# Patient Record
Sex: Female | Born: 1980 | Race: White | Marital: Married | Smoking: Former smoker
Health system: Southern US, Community
[De-identification: ages and names within clinical notes are randomized; demographics above are authoritative.]

## PROBLEM LIST (undated history)

## (undated) DIAGNOSIS — E282 Polycystic ovarian syndrome: Secondary | ICD-10-CM

## (undated) DIAGNOSIS — G43909 Migraine, unspecified, not intractable, without status migrainosus: Secondary | ICD-10-CM

## (undated) DIAGNOSIS — D219 Benign neoplasm of connective and other soft tissue, unspecified: Secondary | ICD-10-CM

## (undated) DIAGNOSIS — I10 Essential (primary) hypertension: Secondary | ICD-10-CM

## (undated) DIAGNOSIS — F32A Depression, unspecified: Secondary | ICD-10-CM

## (undated) HISTORY — DX: Migraine, unspecified, not intractable, without status migrainosus: G43.909

## (undated) HISTORY — DX: Benign neoplasm of connective and other soft tissue, unspecified: D21.9

## (undated) HISTORY — DX: Depression, unspecified: F32.A

## (undated) HISTORY — DX: Essential (primary) hypertension: I10

## (undated) HISTORY — DX: Polycystic ovarian syndrome: E28.2

## (undated) HISTORY — PX: BREAST LUMPECTOMY: SHX2

---
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
## 2002-04-15 ENCOUNTER — Other Ambulatory Visit: Admission: RE | Admit: 2002-04-15 | Discharge: 2002-04-15 | Payer: Self-pay | Admitting: Family Medicine

## 2014-07-08 ENCOUNTER — Other Ambulatory Visit: Payer: Self-pay | Admitting: Orthopedic Surgery

## 2014-07-08 DIAGNOSIS — G8929 Other chronic pain: Secondary | ICD-10-CM

## 2014-07-08 DIAGNOSIS — M545 Low back pain: Principal | ICD-10-CM

## 2014-07-08 NOTE — Procedures (Signed)
°

## 2014-07-14 NOTE — Discharge Instructions (Signed)
Discogram Post Procedure Discharge Instructions ° °1. May resume a regular diet and any medications that you routinely take (including pain medications). °2. No driving day of procedure. °3. Upon discharge go home and rest for at least 4 hours.  May use an ice pack as needed to injection sites on back.  Ice to back 30 minutes on and 30 minutes off, all day. °4. May remove bandades later, today. °5. It is not unusual to be sore for several days after this procedure. ° ° ° °Please contact our office at [phone] for the following symptoms: ° °· Fever greater than 100 degrees °· Increased swelling, pain, or redness at injection site. ° ° °Thank you for visiting [facility]. ° ° °

## 2014-07-15 ENCOUNTER — Other Ambulatory Visit: Payer: Self-pay | Admitting: Orthopedic Surgery

## 2014-07-15 ENCOUNTER — Other Ambulatory Visit: Payer: Self-pay

## 2014-07-15 ENCOUNTER — Inpatient Hospital Stay
Admission: RE | Admit: 2014-07-15 | Discharge: 2014-07-15 | Disposition: A | Discharge status: Home / Self Care | Payer: Self-pay | Source: Ambulatory Visit | Attending: Orthopedic Surgery | Admitting: Orthopedic Surgery

## 2014-07-15 ENCOUNTER — Ambulatory Visit
Admission: RE | Admit: 2014-07-15 | Discharge: 2014-07-15 | Disposition: A | Discharge status: Home / Self Care | Payer: Worker's Compensation | Source: Ambulatory Visit | Attending: Orthopedic Surgery | Admitting: Orthopedic Surgery

## 2014-07-15 VITALS — BP 140/86 | HR 82 | Temp 97.8°F | Resp 20

## 2014-07-15 DIAGNOSIS — M545 Low back pain, unspecified: Secondary | ICD-10-CM

## 2014-07-15 DIAGNOSIS — G8929 Other chronic pain: Secondary | ICD-10-CM

## 2014-07-15 DIAGNOSIS — M5441 Lumbago with sciatica, right side: Secondary | ICD-10-CM

## 2014-07-15 DIAGNOSIS — M5442 Lumbago with sciatica, left side: Principal | ICD-10-CM

## 2014-07-15 MED ORDER — CEFAZOLIN SODIUM-DEXTROSE 2-3 GM-% IV SOLR
2.0000 g | Freq: Once | INTRAVENOUS | Status: AC
  Administered 2014-07-15: 2 g via INTRAVENOUS

## 2014-07-15 MED ORDER — MIDAZOLAM HCL 2 MG/2ML IJ SOLN
1.0000 mg | INTRAMUSCULAR | Status: DC | PRN
  Administered 2014-07-15: 2 mg via INTRAVENOUS
  Administered 2014-07-15 (×2): 1 mg via INTRAVENOUS

## 2014-07-15 MED ORDER — IOHEXOL 180 MG/ML  SOLN
10.5000 mL | Freq: Once | INTRAMUSCULAR | Status: AC | PRN
  Administered 2014-07-15: 10.5 mL

## 2014-07-15 MED ORDER — KETOROLAC TROMETHAMINE 30 MG/ML IJ SOLN
30.0000 mg | Freq: Once | INTRAMUSCULAR | Status: AC
  Administered 2014-07-15: 30 mg via INTRAVENOUS

## 2014-07-15 MED ORDER — FENTANYL CITRATE 0.05 MG/ML IJ SOLN
25.0000 ug | INTRAMUSCULAR | Status: DC | PRN
  Administered 2014-07-15: 75 ug via INTRAVENOUS
  Administered 2014-07-15: 25 ug via INTRAVENOUS

## 2014-07-15 MED ORDER — SODIUM CHLORIDE 0.9 % IV SOLN
INTRAVENOUS | Status: DC
  Administered 2014-07-15: 12:00:00 via INTRAVENOUS

## 2014-07-15 NOTE — Procedures (Signed)
°

## 2014-07-15 NOTE — Progress Notes (Signed)
Sedation time for discogram 47 minutes.

## 2015-01-09 IMAGING — CT CT L SPINE W/ CM
4 of 10 series · 11 of 33 positions shown, 13 images · non-contrast
Comparison: none

CLINICAL DATA: Low back pain extending into the lower extremities
bilaterally.

[Series 2: l spine bone · axial · 0.27mm/px · z∈[-284,-199]mm · 2 of 103 slices shown, 3 images]
[im 35/103  soft-tissue]
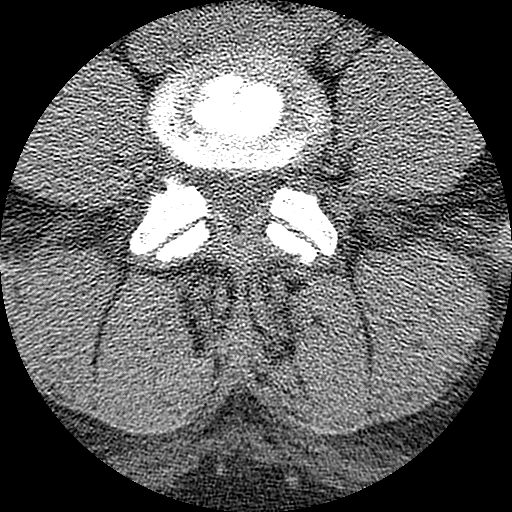
[im 35/103  bone]
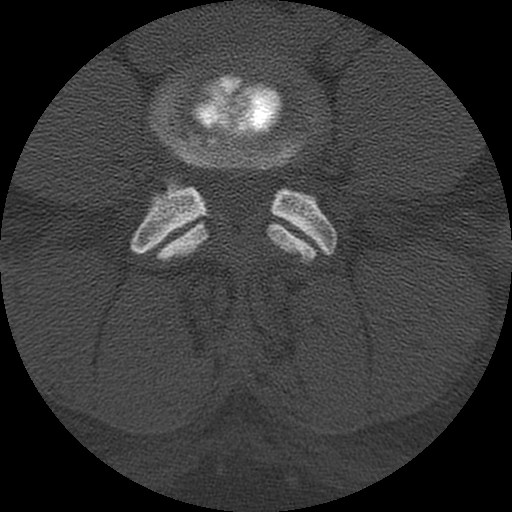
[im 69/103  bone]
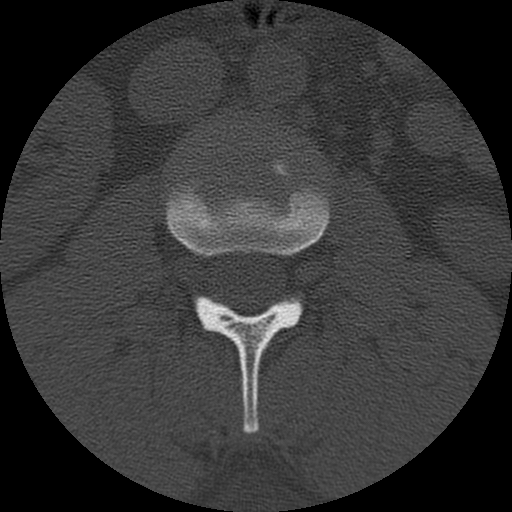

[Series 3: l spine soft · axial · 0.27mm/px · z∈[-284,-199]mm · 2 of 103 slices shown]
[im 35/103  soft-tissue]
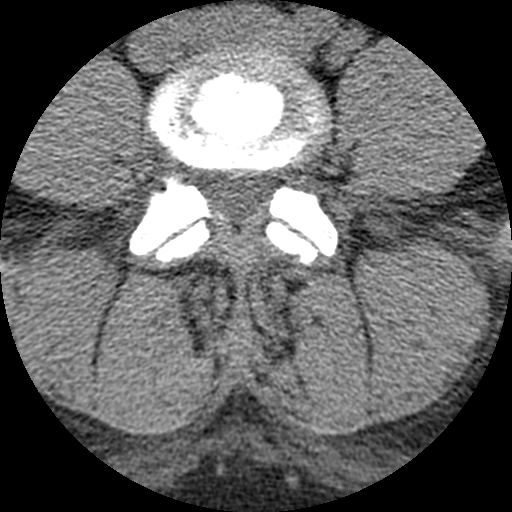
[im 69/103  soft-tissue]
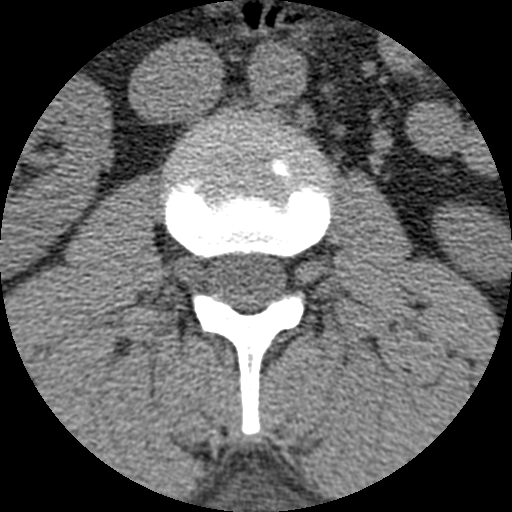

[Series 400: cor upper · coronal · 0.51mm/px · 2 of 57 slices shown]
[im 27/57  bone]
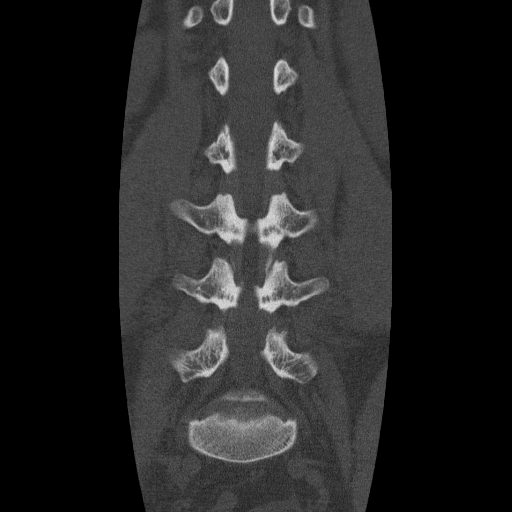
[im 53/57  bone]
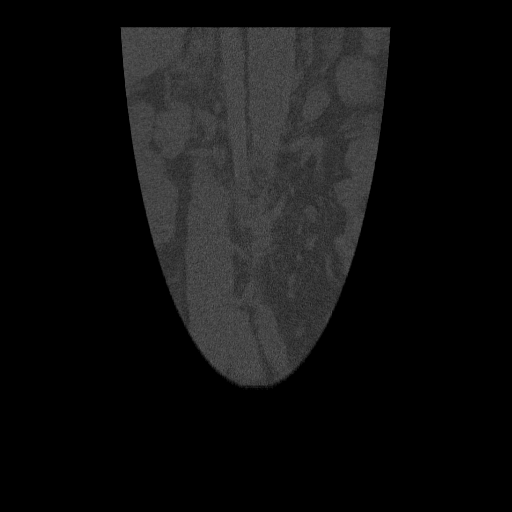

[Series 402: sag · sagittal · 0.51mm/px · 5 of 59 slices shown, 6 images]
[im 20/59  bone]
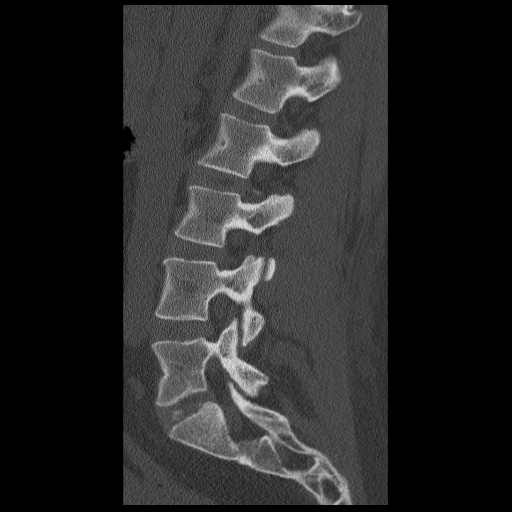
[im 25/59  bone]
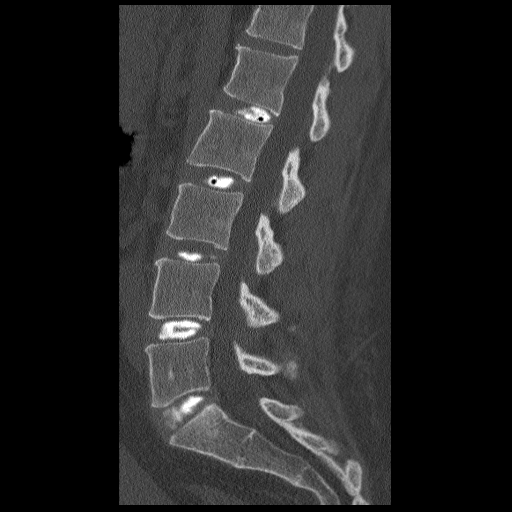
[im 30/59  soft-tissue]
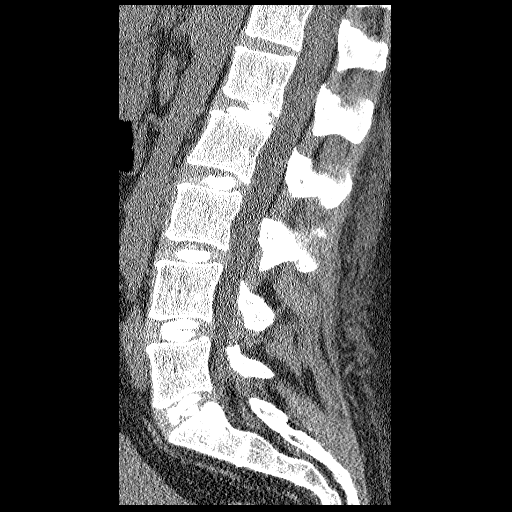
[im 30/59  bone]
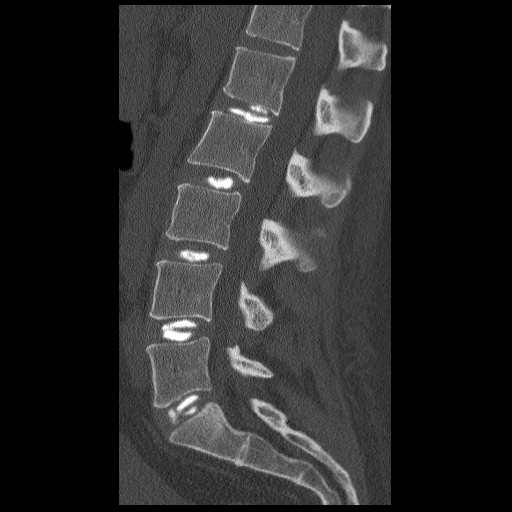
[im 34/59  bone]
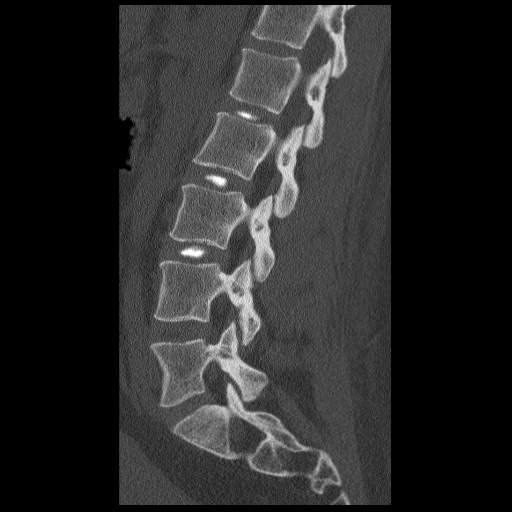
[im 39/59  bone]
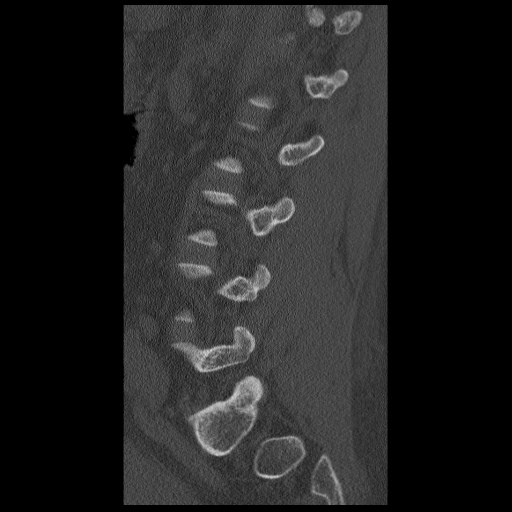

[11 of 33 positions shown; findings below may reference images not displayed]

EXAM:
DIAGNOSTIC LUMBAR DISK INJECTIONS

FLUOROSCOPY TIME:  5 min 26 seconds

PROCEDURE:
The procedure was discussed in depth with the patient including the
potential risk of infection. The patient received 2.0 gram of Ancef
intravenously prior to the procedure. She received 4.0 mg of Versed
intravenously during the procedure.

The patient was placed prone on the fluoroscopic table. A Betadine
scrub of the low back was performed and the patient was draped in a
sterile fashion.

Skin anesthesia was carried [DATE]% Lidocaine. 15 cm 22 gauge
Mckinley Hyder were directed into the nuclear regions of the disks at
L1-2, L2-3, and L3-4. 20 cm 22 gauge Mckinley Hyder were used at L4-5
and L5-S1. Omnipaque 180 was injected using a pressure monitoring
syringe. Spot radiographs were taken. The patient's symptoms were
recorded. Following the procedure, she was treated with intravenous
Toradol 30 mg IV and Fentanyl 100 mcg IV. She was taken to CT scan
in good condition. The patient was observed for an hour after the CT
scan and discharged in good condition.
FINDINGS: Patient stated her pain and baseline was [DATE]. After placement of
all 5 needles, her pain was 7 or [DATE].

L1-2: The opening pressure was 40 PSI. The patient experienced pain
at 60 PSI. The pain was concordant. 2.0 ml of contrast was used. The
patient rated her pain as [DATE]. She described the pain as typical of
her low back pain without significant lower extremity radiculopathy.
Imaging demonstrated contrast contained within the central nucleus
of the disc.

L2-3: The opening pressure was 35 PSI. The patient experienced pain
at 35 PSI. The pain was concordant. 2.0 ml of contrast was used. The
patient rated her pain as [DATE]. She described the pain as most
typical of her low back pain of any of the levels. No lower
extremity pain was evident. Imaging demonstrates contrast contained
within the central nucleus of the disc.

L3-4: The opening pressure was 40 PSI. The patient experienced pain
at 60 PSI, but tolerated pressure of 120 PSI. The pain was
discordant. 2.5 ml of contrast was used. The patient rated her pain
as [DATE]. She described the pain as mostly pressure, not typical of
her usual pain. The patient was having significant residual pain
from the L2-3 level. No lower extremity pain was evident. Imaging
demonstrates contrast contained within the central nucleus of the
disc.

L4-5: The opening pressure was 40 PSI. The patient experienced pain
at 60 PSI, but tolerated pressure of 120 PSI. The pain was
discordant. 2.0 ml of contrast was used. The patient rated her pain
as [DATE]. She described the pain as mostly pressure, not typical of
her usual pain. The patient continued to have some residual pain
from the L2-3 level injection. No lower extremity pain was evident.
Imaging demonstrates contrast contained within the central nucleus
of the disc.

L5-S1: The opening pressure was 40 PSI. The patient experienced pain
at 60 PSI, but tolerated pressure of 100 PSI. The pain was partially
concordant. 2.0 ml of contrast was used. The patient rated her pain
as [DATE]. She described the pain as somewhat typical of her low back
pain. It was similar to the L1-2 level but not as concordant as the
L2-3 level. Imaging demonstrates contrast contained within the
central nucleus of the disc.

CT of the lumbar spine was performed after the discogram.

Five non rib-bearing lumbar type vertebral bodies are present.

As noted on the plain film images, contrast is contained within the
central nucleus of the disc at each level. There is no significant
focal protrusion or stenosis.
IMPRESSION: 1. The most significant level and chemo sensitive disc is L2-3 with
replication of the patient's low back pain but no lower extremity
radicular symptoms.
2. Mild concordance is evident at L1-2 and L5-S1. The pain was
experienced at L1-2 at much lower levels.
3. Discordant pressure was felt at L3-4 and L4-5.
4. No focal annular fissure or disc protrusion is evident.
5. The patient's lower extremity symptoms were not replicated at any
level.

## 2016-03-21 DIAGNOSIS — E282 Polycystic ovarian syndrome: Secondary | ICD-10-CM | POA: Insufficient documentation

## 2016-03-21 DIAGNOSIS — T50905A Adverse effect of unspecified drugs, medicaments and biological substances, initial encounter: Secondary | ICD-10-CM | POA: Insufficient documentation

## 2016-03-21 DIAGNOSIS — E781 Pure hyperglyceridemia: Secondary | ICD-10-CM | POA: Insufficient documentation

## 2016-03-21 DIAGNOSIS — E559 Vitamin D deficiency, unspecified: Secondary | ICD-10-CM | POA: Insufficient documentation

## 2016-04-24 DIAGNOSIS — B029 Zoster without complications: Secondary | ICD-10-CM | POA: Insufficient documentation

## 2018-08-27 DIAGNOSIS — N939 Abnormal uterine and vaginal bleeding, unspecified: Secondary | ICD-10-CM | POA: Insufficient documentation

## 2021-07-18 LAB — HM PAP SMEAR: HM Pap smear: NORMAL

## 2021-07-21 LAB — HM MAMMOGRAPHY

## 2022-01-10 ENCOUNTER — Encounter: Payer: Self-pay | Admitting: Nurse Practitioner

## 2022-01-10 ENCOUNTER — Other Ambulatory Visit: Payer: Self-pay

## 2022-01-10 ENCOUNTER — Telehealth: Payer: Self-pay

## 2022-01-10 ENCOUNTER — Ambulatory Visit (INDEPENDENT_AMBULATORY_CARE_PROVIDER_SITE_OTHER): Payer: 59 | Admitting: Nurse Practitioner

## 2022-01-10 ENCOUNTER — Other Ambulatory Visit: Payer: Self-pay | Admitting: Nurse Practitioner

## 2022-01-10 VITALS — BP 126/88 | HR 73 | Temp 97.3°F | Wt 251.0 lb

## 2022-01-10 DIAGNOSIS — J3089 Other allergic rhinitis: Secondary | ICD-10-CM

## 2022-01-10 DIAGNOSIS — F331 Major depressive disorder, recurrent, moderate: Secondary | ICD-10-CM

## 2022-01-10 DIAGNOSIS — Z7689 Persons encountering health services in other specified circumstances: Secondary | ICD-10-CM

## 2022-01-10 DIAGNOSIS — R030 Elevated blood-pressure reading, without diagnosis of hypertension: Secondary | ICD-10-CM

## 2022-01-10 DIAGNOSIS — I10 Essential (primary) hypertension: Secondary | ICD-10-CM | POA: Diagnosis not present

## 2022-01-10 DIAGNOSIS — E041 Nontoxic single thyroid nodule: Secondary | ICD-10-CM

## 2022-01-10 MED ORDER — FLUTICASONE PROPIONATE 50 MCG/ACT NA SUSP: 2.0000 | Freq: Every day | NASAL | 6 refills | Status: DC

## 2022-01-10 MED ORDER — VALSARTAN 80 MG PO TABS: 80.0000 mg | ORAL_TABLET | Freq: Every day | ORAL | 3 refills | Status: DC

## 2022-01-10 MED ORDER — SERTRALINE HCL 50 MG PO TABS: 100.0000 mg | ORAL_TABLET | Freq: Every day | ORAL | 3 refills | Status: DC

## 2022-01-10 NOTE — Progress Notes (Signed)
Subjective:  Patient ID: Virginia Brock, female    DOB: [birth year]  Age: 41 y.o. MRN: [id number]  Chief Complaint  Patient presents with   Establish Care    HPI  Virginia Brock is a 41 year old Caucasian female that presents to establish care. This is her initial visit to the office. She tells me she was recently seen in ED in [facility] for elevated BP. She denies previous history of hypertension. Denies chest pain, dizziness, or dyspnea. CT performed in ED revealed right thyroid nodule. She denies previous history of thyroid disease, difficulty swallowing, intolerance to temperature extremes.   No current outpatient medications on file prior to visit.   No current facility-administered medications on file prior to visit.       Social History   Socioeconomic History   Marital status: Married    Spouse name: Virginia Brock   Number of children: 2   Years of education: Not on file   Highest education level: Not on file  Occupational History   Not on file  Tobacco Use   Smoking status: Former    Packs/day: 0.50    Years: 4.00    Pack years: 2.00    Types: Cigarettes    Quit date: 08/15/2013    Years since quitting: 8.4   Smokeless tobacco: Never  Substance and Sexual Activity   Alcohol use: Yes   Drug use: Not Currently    Types: Marijuana    Comment: Used as a teenager   Sexual activity: Yes    Partners: Male    Birth control/protection: I.U.D.  Other Topics Concern   Not on file  Social History Narrative   ** Merged History Encounter **       Social Determinants of Health   Financial Resource Strain: Not on file  Food Insecurity: Not on file  Transportation Needs: Not on file  Physical Activity: Not on file  Stress: Not on file  Social Connections: Not on file    Review of Systems  Constitutional:  Negative for chills, fatigue and fever.  HENT:  Negative for congestion, ear pain, rhinorrhea and sore throat.   Respiratory:  Negative for cough and shortness  of breath.   Cardiovascular:  Negative for chest pain.  Gastrointestinal:  Negative for abdominal pain, constipation, diarrhea, nausea and vomiting.  Genitourinary:  Negative for dysuria and urgency.  Musculoskeletal:  Positive for back pain (lower). Negative for arthralgias and myalgias.  Neurological:  Positive for headaches. Negative for dizziness, weakness and light-headedness.  Psychiatric/Behavioral:  Positive for dysphoric mood. The patient is nervous/anxious.     Objective:  Pulse 73    Temp (!) 97.3 F (36.3 C)    Wt 251 lb (113.9 kg)    HC 66" (167.6 cm)    SpO2 99%  BP 126/88    Pulse 73    Temp (!) 97.3 F (36.3 C)    Wt 251 lb (113.9 kg)    HC 66" (167.6 cm)    SpO2 99%    BP/Weight 01/10/2022 05/09/3709  Systolic BP - 626  Diastolic BP - 86  Wt. (Lbs) 251 -    Physical Exam Vitals reviewed.  Constitutional:      Appearance: She is obese.  HENT:     Head: Normocephalic.     Right Ear: Tympanic membrane normal.     Left Ear: Tympanic membrane normal.     Nose: Congestion present.     Mouth/Throat:     Mouth: Mucous membranes are moist.  Pharynx: Posterior oropharyngeal erythema present.  Eyes:     Pupils: Pupils are equal, round, and reactive to light.     Comments: Eyeglasses in place  Cardiovascular:     Rate and Rhythm: Normal rate and regular rhythm.     Pulses: Normal pulses.     Heart sounds: Normal heart sounds.  Pulmonary:     Effort: Pulmonary effort is normal.     Breath sounds: Normal breath sounds.  Abdominal:     General: Bowel sounds are normal.     Palpations: Abdomen is soft.  Musculoskeletal:        General: Normal range of motion.     Cervical back: Neck supple.  Skin:    General: Skin is warm and dry.     Capillary Refill: Capillary refill takes less than 2 seconds.  Neurological:     General: No focal deficit present.     Mental Status: She is alert and oriented to person, place, and time.  Psychiatric:        Mood and Affect:  Mood normal.        Behavior: Behavior normal.            Assessment & Plan:  1. Essential hypertension - TSH - valsartan (DIOVAN) 80 MG tablet; Take 1 tablet (80 mg total) by mouth daily.  Dispense: 30 tablet; Refill: 3  2. Right thyroid nodule - US THYROID; Future  3. Moderate recurrent major depression (HCC) - sertraline (ZOLOFT) 50 MG tablet; Take 2 tablets (100 mg total) by mouth daily. Take 50 mg by mouth daily for one week, then increase to 100 mg by mouth daily  Dispense: 60 tablet; Refill: 3  4. Elevated BP without diagnosis of hypertension - valsartan (DIOVAN) 80 MG tablet; Take 1 tablet (80 mg total) by mouth daily.  Dispense: 30 tablet; Refill: 3  5. Morbid obesity (HCC) - TSH  6. Encounter to establish care - [facility] PAP SMEAR - [facility]   Resume Zoloft 50 mg daily for 1 week then increase to 100 mg daily Valsartan 80 mg daily Monitor BP, keep log Return in 2 weeks with BP log We will call you with lab results and thyroid US appointmen   Orders Placed This Encounter  Procedures   [facility] MAMMOGRAPHY   [facility] PAP SMEAR     Follow-up: 2-weeks  An After Visit Summary was printed and given to the patient.  I, Rip Harbour, NP, have reviewed all documentation for this visit. The documentation on 01/10/22 for the exam, diagnosis, procedures, and orders are all accurate and complete.    Rip Harbour, NP [place] [phone]

## 2022-01-10 NOTE — Telephone Encounter (Signed)
Received call from [facility] in [place] requesting flonase for patient.   Virginia Brock 01/10/22 2:47 PM

## 2022-01-10 NOTE — Patient Instructions (Signed)
Resume Zoloft 50 mg daily for 1 week then increase to 100 mg daily Valsartan 80 mg daily Monitor BP, keep log Return in 2 weeks with BP log We will call you with lab results and thyroid US appointment     Managing Your Hypertension Hypertension, also called high blood pressure, is when the force of the blood pressing against the walls of the arteries is too strong. Arteries are blood vessels that carry blood from your heart throughout your body. Hypertension forces the heart to work harder to pump blood and may cause the arteries to become narrow or stiff. Understanding blood pressure readings Your personal target blood pressure may vary depending on your medical conditions, your age, and other factors. A blood pressure reading includes a higher number over a lower number. Ideally, your blood pressure should be below 120/80. You should know that: The first, or top, number is called the systolic pressure. It is a measure of the pressure in your arteries as your heart beats. The second, or bottom number, is called the diastolic pressure. It is a measure of the pressure in your arteries as the heart relaxes. Blood pressure is classified into four stages. Based on your blood pressure reading, your health care provider may use the following stages to determine what type of treatment you need, if any. Systolic pressure and diastolic pressure are measured in a unit called mmHg. Normal Systolic pressure: below 381. Diastolic pressure: below 80. Elevated Systolic pressure: 017-510. Diastolic pressure: below 80. Hypertension stage 1 Systolic pressure: 258-527. Diastolic pressure: 78-24. Hypertension stage 2 Systolic pressure: 235 or above. Diastolic pressure: 90 or above. How can this condition affect me? Managing your hypertension is an important responsibility. Over time, hypertension can damage the arteries and decrease blood flow to important parts of the body, including the brain, heart, and  kidneys. Having untreated or uncontrolled hypertension can lead to: A heart attack. A stroke. A weakened blood vessel (aneurysm). Heart failure. Kidney damage. Eye damage. Metabolic syndrome. Memory and concentration problems. Vascular dementia. What actions can I take to manage this condition? Hypertension can be managed by making lifestyle changes and possibly by taking medicines. Your health care provider will help you make a plan to bring your blood pressure within a normal range. Nutrition  Eat a diet that is high in fiber and potassium, and low in salt (sodium), added sugar, and fat. An example eating plan is called the Dietary Approaches to Stop Hypertension (DASH) diet. To eat this way: Eat plenty of fresh fruits and vegetables. Try to fill one-half of your plate at each meal with fruits and vegetables. Eat whole grains, such as whole-wheat pasta, brown rice, or whole-grain bread. Fill about one-fourth of your plate with whole grains. Eat low-fat dairy products. Avoid fatty cuts of meat, processed or cured meats, and poultry with skin. Fill about one-fourth of your plate with lean proteins such as fish, chicken without skin, beans, eggs, and tofu. Avoid pre-made and processed foods. These tend to be higher in sodium, added sugar, and fat. Reduce your daily sodium intake. Most people with hypertension should eat less than 1,500 mg of sodium a day. Lifestyle  Work with your health care provider to maintain a healthy body weight or to lose weight. Ask what an ideal weight is for you. Get at least 30 minutes of exercise that causes your heart to beat faster (aerobic exercise) most days of the week. Activities may include walking, swimming, or biking. Include exercise to strengthen your  muscles (resistance exercise), such as weight lifting, as part of your weekly exercise routine. Try to do these types of exercises for 30 minutes at least 3 days a week. Do not use any products that  contain nicotine or tobacco, such as cigarettes, e-cigarettes, and chewing tobacco. If you need help quitting, ask your health care provider. Control any long-term (chronic) conditions you have, such as high cholesterol or diabetes. Identify your sources of stress and find ways to manage stress. This may include meditation, deep breathing, or making time for fun activities. Alcohol use Do not drink alcohol if: Your health care provider tells you not to drink. You are pregnant, may be pregnant, or are planning to become pregnant. If you drink alcohol: Limit how much you use to: 0-1 drink a day for women. 0-2 drinks a day for men. Be aware of how much alcohol is in your drink. In the [place] one drink equals one 12 oz bottle of beer (355 mL), one 5 oz glass of wine (148 mL), or one 1 oz glass of hard liquor (44 mL). Medicines Your health care provider may prescribe medicine if lifestyle changes are not enough to get your blood pressure under control and if: Your systolic blood pressure is 130 or higher. Your diastolic blood pressure is 80 or higher. Take medicines only as told by your health care provider. Follow the directions carefully. Blood pressure medicines must be taken as told by your health care provider. The medicine does not work as well when you skip doses. Skipping doses also puts you at risk for problems. Monitoring Before you monitor your blood pressure: Do not smoke, drink caffeinated beverages, or exercise within 30 minutes before taking a measurement. Use the bathroom and empty your bladder (urinate). Sit quietly for at least 5 minutes before taking measurements. Monitor your blood pressure at home as told by your health care provider. To do this: Sit with your back straight and supported. Place your feet flat on the floor. Do not cross your legs. Support your arm on a flat surface, such as a table. Make sure your upper arm is at heart level. Each time you measure, take two  or three readings one minute apart and record the results. You may also need to have your blood pressure checked regularly by your health care provider. General information Talk with your health care provider about your diet, exercise habits, and other lifestyle factors that may be contributing to hypertension. Review all the medicines you take with your health care provider because there may be side effects or interactions. Keep all visits as told by your health care provider. Your health care provider can help you create and adjust your plan for managing your high blood pressure. Where to find more information National Heart, Lung, and Blood Institute: https://wilson-eaton.com/ American Heart Association: www.heart.org Contact a health care provider if: You think you are having a reaction to medicines you have taken. You have repeated (recurrent) headaches. You feel dizzy. You have swelling in your ankles. You have trouble with your vision. Get help right away if: You develop a severe headache or confusion. You have unusual weakness or numbness, or you feel faint. You have severe pain in your chest or abdomen. You vomit repeatedly. You have trouble breathing. These symptoms may represent a serious problem that is an emergency. Do not wait to see if the symptoms will go away. Get medical help right away. Call your local emergency services (911 in the [place] Do not  drive yourself to the hospital. Summary Hypertension is when the force of blood pumping through your arteries is too strong. If this condition is not controlled, it may put you at risk for serious complications. Your personal target blood pressure may vary depending on your medical conditions, your age, and other factors. For most people, a normal blood pressure is less than 120/80. Hypertension is managed by lifestyle changes, medicines, or both. Lifestyle changes to help manage hypertension include losing weight, eating a healthy,  low-sodium diet, exercising more, stopping smoking, and limiting alcohol. This information is not intended to replace advice given to you by your health care provider. Make sure you discuss any questions you have with your health care provider. Document Revised: 12/07/2019 Document Reviewed: 10/20/2019 Elsevier Patient Education  2022 [place] Eating Plan DASH stands for Dietary Approaches to Stop Hypertension. The DASH eating plan is a healthy eating plan that has been shown to: Reduce high blood pressure (hypertension). Reduce your risk for type 2 diabetes, heart disease, and stroke. Help with weight loss. What are tips for following this plan? Reading food labels Check food labels for the amount of salt (sodium) per serving. Choose foods with less than 5 percent of the Daily Value of sodium. Generally, foods with less than 300 milligrams (mg) of sodium per serving fit into this eating plan. To find whole grains, look for the word "whole" as the first word in the ingredient list. Shopping Buy products labeled as "low-sodium" or "no salt added." Buy fresh foods. Avoid canned foods and pre-made or frozen meals. Cooking Avoid adding salt when cooking. Use salt-free seasonings or herbs instead of table salt or sea salt. Check with your health care provider or pharmacist before using salt substitutes. Do not fry foods. Cook foods using healthy methods such as baking, boiling, grilling, roasting, and broiling instead. Cook with heart-healthy oils, such as olive, canola, avocado, soybean, or sunflower oil. Meal planning  Eat a balanced diet that includes: 4 or more servings of fruits and 4 or more servings of vegetables each day. Try to fill one-half of your plate with fruits and vegetables. 6-8 servings of whole grains each day. Less than 6 oz (170 g) of lean meat, poultry, or fish each day. A 3-oz (85-g) serving of meat is about the same size as a deck of cards. One egg equals 1  oz (28 g). 2-3 servings of low-fat dairy each day. One serving is 1 cup (237 mL). 1 serving of nuts, seeds, or beans 5 times each week. 2-3 servings of heart-healthy fats. Healthy fats called omega-3 fatty acids are found in foods such as walnuts, flaxseeds, fortified milks, and eggs. These fats are also found in cold-water fish, such as sardines, salmon, and mackerel. Limit how much you eat of: Canned or prepackaged foods. Food that is high in trans fat, such as some fried foods. Food that is high in saturated fat, such as fatty meat. Desserts and other sweets, sugary drinks, and other foods with added sugar. Full-fat dairy products. Do not salt foods before eating. Do not eat more than 4 egg yolks a week. Try to eat at least 2 vegetarian meals a week. Eat more home-cooked food and less restaurant, buffet, and fast food. Lifestyle When eating at a restaurant, ask that your food be prepared with less salt or no salt, if possible. If you drink alcohol: Limit how much you use to: 0-1 drink a day for women who are not pregnant.  0-2 drinks a day for men. Be aware of how much alcohol is in your drink. In the [place] one drink equals one 12 oz bottle of beer (355 mL), one 5 oz glass of wine (148 mL), or one 1 oz glass of hard liquor (44 mL). General information Avoid eating more than 2,300 mg of salt a day. If you have hypertension, you may need to reduce your sodium intake to 1,500 mg a day. Work with your health care provider to maintain a healthy body weight or to lose weight. Ask what an ideal weight is for you. Get at least 30 minutes of exercise that causes your heart to beat faster (aerobic exercise) most days of the week. Activities may include walking, swimming, or biking. Work with your health care provider or dietitian to adjust your eating plan to your individual calorie needs. What foods should I eat? Fruits All fresh, dried, or frozen fruit. Canned fruit in natural juice (without  added sugar). Vegetables Fresh or frozen vegetables (raw, steamed, roasted, or grilled). Low-sodium or reduced-sodium tomato and vegetable juice. Low-sodium or reduced-sodium tomato sauce and tomato paste. Low-sodium or reduced-sodium canned vegetables. Grains Whole-grain or whole-wheat bread. Whole-grain or whole-wheat pasta. Brown rice. [place]. Bulgur. Whole-grain and low-sodium cereals. Pita bread. Low-fat, low-sodium crackers. Whole-wheat flour tortillas. Meats and other proteins Skinless chicken or [place]. Ground chicken or [place]. Pork with fat trimmed off. Fish and seafood. Egg whites. Dried beans, peas, or lentils. Unsalted nuts, nut butters, and seeds. Unsalted canned beans. Lean cuts of beef with fat trimmed off. Low-sodium, lean precooked or cured meat, such as sausages or meat loaves. Dairy Low-fat (1%) or fat-free (skim) milk. Reduced-fat, low-fat, or fat-free cheeses. Nonfat, low-sodium ricotta or cottage cheese. Low-fat or nonfat yogurt. Low-fat, low-sodium cheese. Fats and oils Soft margarine without trans fats. Vegetable oil. Reduced-fat, low-fat, or light mayonnaise and salad dressings (reduced-sodium). Canola, safflower, olive, avocado, soybean, and sunflower oils. Avocado. Seasonings and condiments Herbs. Spices. Seasoning mixes without salt. Other foods Unsalted popcorn and pretzels. Fat-free sweets. The items listed above may not be a complete list of foods and beverages you can eat. Contact a dietitian for more information. What foods should I avoid? Fruits Canned fruit in a light or heavy syrup. Fried fruit. Fruit in cream or butter sauce. Vegetables Creamed or fried vegetables. Vegetables in a cheese sauce. Regular canned vegetables (not low-sodium or reduced-sodium). Regular canned tomato sauce and paste (not low-sodium or reduced-sodium). Regular tomato and vegetable juice (not low-sodium or reduced-sodium). Angie Fava. Olives. Grains Baked goods made with fat,  such as croissants, muffins, or some breads. Dry pasta or rice meal packs. Meats and other proteins Fatty cuts of meat. Ribs. Fried meat. Berniece Salines. Bologna, salami, and other precooked or cured meats, such as sausages or meat loaves. Fat from the back of a pig (fatback). Bratwurst. Salted nuts and seeds. Canned beans with added salt. Canned or smoked fish. Whole eggs or egg yolks. Chicken or [place] with skin. Dairy Whole or 2% milk, cream, and half-and-half. Whole or full-fat cream cheese. Whole-fat or sweetened yogurt. Full-fat cheese. Nondairy creamers. Whipped toppings. Processed cheese and cheese spreads. Fats and oils Butter. Stick margarine. Lard. Shortening. Ghee. Bacon fat. Tropical oils, such as coconut, palm kernel, or palm oil. Seasonings and condiments Onion salt, garlic salt, seasoned salt, table salt, and sea salt. Worcestershire sauce. Tartar sauce. Barbecue sauce. Teriyaki sauce. Soy sauce, including reduced-sodium. Steak sauce. Canned and packaged gravies. Fish sauce. Oyster sauce. Cocktail sauce. Store-bought horseradish.  Ketchup. Mustard. Meat flavorings and tenderizers. Bouillon cubes. Hot sauces. Pre-made or packaged marinades. Pre-made or packaged taco seasonings. Relishes. Regular salad dressings. Other foods Salted popcorn and pretzels. The items listed above may not be a complete list of foods and beverages you should avoid. Contact a dietitian for more information. Where to find more information National Heart, Lung, and Blood Institute: https://wilson-eaton.com/ American Heart Association: www.heart.org Academy of Nutrition and Dietetics: www.eatright.[place]: www.kidney.org Summary The DASH eating plan is a healthy eating plan that has been shown to reduce high blood pressure (hypertension). It may also reduce your risk for type 2 diabetes, heart disease, and stroke. When on the DASH eating plan, aim to eat more fresh fruits and vegetables, whole grains,  lean proteins, low-fat dairy, and heart-healthy fats. With the DASH eating plan, you should limit salt (sodium) intake to 2,300 mg a day. If you have hypertension, you may need to reduce your sodium intake to 1,500 mg a day. Work with your health care provider or dietitian to adjust your eating plan to your individual calorie needs. This information is not intended to replace advice given to you by your health care provider. Make sure you discuss any questions you have with your health care provider. Document Revised: 10/23/2019 Document Reviewed: 10/23/2019 Elsevier Patient Education  2022 [employer].

## 2022-01-11 LAB — TSH: TSH: 1.38 u[IU]/mL (ref 0.450–4.500)

## 2022-01-17 ENCOUNTER — Other Ambulatory Visit: Payer: Self-pay

## 2022-01-17 ENCOUNTER — Ambulatory Visit (HOSPITAL_BASED_OUTPATIENT_CLINIC_OR_DEPARTMENT_OTHER)
Admission: RE | Admit: 2022-01-17 | Discharge: 2022-01-17 | Disposition: A | Discharge status: Home / Self Care | Payer: 59 | Source: Ambulatory Visit | Attending: Nurse Practitioner | Admitting: Nurse Practitioner

## 2022-01-17 DIAGNOSIS — E041 Nontoxic single thyroid nodule: Secondary | ICD-10-CM | POA: Insufficient documentation

## 2022-01-18 ENCOUNTER — Other Ambulatory Visit: Payer: Self-pay | Admitting: Nurse Practitioner

## 2022-01-18 ENCOUNTER — Encounter: Payer: Self-pay | Admitting: Nurse Practitioner

## 2022-01-18 DIAGNOSIS — E041 Nontoxic single thyroid nodule: Secondary | ICD-10-CM

## 2022-01-23 ENCOUNTER — Ambulatory Visit (INDEPENDENT_AMBULATORY_CARE_PROVIDER_SITE_OTHER): Payer: 59 | Admitting: Nurse Practitioner

## 2022-01-23 ENCOUNTER — Other Ambulatory Visit: Payer: Self-pay

## 2022-01-23 ENCOUNTER — Encounter: Payer: Self-pay | Admitting: Nurse Practitioner

## 2022-01-23 VITALS — BP 112/80 | HR 95 | Temp 97.3°F | Ht 66.0 in | Wt 251.0 lb

## 2022-01-23 DIAGNOSIS — L409 Psoriasis, unspecified: Secondary | ICD-10-CM

## 2022-01-23 DIAGNOSIS — J309 Allergic rhinitis, unspecified: Secondary | ICD-10-CM | POA: Diagnosis not present

## 2022-01-23 DIAGNOSIS — E876 Hypokalemia: Secondary | ICD-10-CM | POA: Diagnosis not present

## 2022-01-23 MED ORDER — LEVOCETIRIZINE DIHYDROCHLORIDE 5 MG PO TABS: 5.0000 mg | ORAL_TABLET | Freq: Every evening | ORAL | 2 refills | Status: DC

## 2022-01-23 MED ORDER — POTASSIUM CHLORIDE ER 10 MEQ PO TBCR
10.0000 meq | EXTENDED_RELEASE_TABLET | Freq: Every day | ORAL | 1 refills | Status: DC
Start: 2022-01-23 — End: 2022-05-21

## 2022-01-23 MED ORDER — TRIAMCINOLONE ACETONIDE 0.1 % EX CREA: 1.0000 "application " | TOPICAL_CREAM | Freq: Two times a day (BID) | CUTANEOUS | 0 refills | Status: DC

## 2022-01-23 NOTE — Progress Notes (Signed)
Subjective:  Patient ID: Virginia Brock, female    DOB: May 18, 1981  Age: 41 y.o. MRN: [id number]  Chief Complaint  Patient presents with   Hypertension    HPI Virginia Brock is a 41 year old Caucasian female that presents to follow-up on elevated BP. She was prescribed Valsartan 80 mg daily. She was found to have hypokalemia of 3.3 on 12/13/21. She tells me she has a psoriatic rash to anterior left knee.     Hypertension, follow-up:  She was last seen for hypertension 2 weeks ago.  BP at that visit was 126/88. Management since that visit includes Valsartan.  She reports excellent compliance with treatment. She is not having side effects.  She is following a Regular diet. She is not exercising. She does not smoke. States she no longer "keeps a headache"  Use of agents associated with hypertension: none.   Outside blood pressures are 137/86,    139/98,    139/93,       130/109,        136/98,     121/74,     113/64,      129/85,      125/97,      129/88,    122/8,      131/85,     131/88,     144/100,    133/95,      135/94,      130/92,    123/74,       131/91,     133/96,     115/82,      119/81,      123/88,      119/69,    113/67,    118/80,     105/84,     128/88,      131/78,      127/78,      120/83. Symptoms: No chest pain No chest pressure  No palpitations No syncope  No dyspnea No orthopnea  No paroxysmal nocturnal dyspnea No lower extremity edema         Current Outpatient Medications on File Prior to Visit  Medication Sig Dispense Refill   valsartan (DIOVAN) 80 MG tablet Take 1 tablet (80 mg total) by mouth daily. 30 tablet 3   fluticasone (FLONASE) 50 MCG/ACT nasal spray Place 2 sprays into both nostrils daily. (Patient not taking: Reported on 01/23/2022) 16 g 6   sertraline (ZOLOFT) 50 MG tablet Take 2 tablets (100 mg total) by mouth daily. Take 50 mg by mouth daily for one week, then increase to 100 mg by mouth daily 60 tablet 3   No current  facility-administered medications on file prior to visit.   Past Medical History:  Diagnosis Date   Depression    Fibroid tumor    Right Breast Lumpectomy   Hypertension    Migraine    PCOS (polycystic ovarian syndrome)    Past Surgical History:  Procedure Laterality Date   BREAST LUMPECTOMY Right    Fibroid Tumor   CESAREAN SECTION      Family History  Problem Relation Age of Onset   Stroke Mother    Parkinson's disease Mother    Atrial fibrillation Mother    Cancer Father        breast, liver, spine and bone   Cancer Other        prostate, colon, liver, lung   Diabetes Other    Social History   Socioeconomic History   Marital status: Married    Spouse name: Jailene Cupit  Number of children: 2   Years of education: Not on file   Highest education level: Not on file  Occupational History   Not on file  Tobacco Use   Smoking status: Former    Packs/day: 0.50    Years: 4.00    Pack years: 2.00    Types: Cigarettes    Quit date: 08/15/2013    Years since quitting: 8.4   Smokeless tobacco: Never  Substance and Sexual Activity   Alcohol use: Yes   Drug use: Not Currently    Types: Marijuana    Comment: Used as a teenager   Sexual activity: Yes    Partners: Male    Birth control/protection: I.U.D.  Other Topics Concern   Not on file  Social History Narrative   ** Merged History Encounter **       Social Determinants of Health   Financial Resource Strain: Not on file  Food Insecurity: Not on file  Transportation Needs: Not on file  Physical Activity: Not on file  Stress: Not on file  Social Connections: Not on file    Review of Systems  Constitutional:  Negative for chills, fatigue and fever.  HENT:  Negative for congestion, ear pain, rhinorrhea and sore throat.   Respiratory:  Negative for cough and shortness of breath.   Cardiovascular:  Negative for chest pain.  Gastrointestinal:  Negative for abdominal pain,  constipation, diarrhea, nausea and vomiting.  Genitourinary:  Negative for dysuria and urgency.  Musculoskeletal:  Negative for back pain and myalgias.  Neurological:  Negative for dizziness, weakness, light-headedness and headaches.  Psychiatric/Behavioral:  Negative for dysphoric mood. The patient is not nervous/anxious.     Objective:  Pulse (!) 107    Temp (!) 97.3 F (36.3 C)    Ht 5\' 6"  (1.676 m)    Wt 251 lb (113.9 kg)    SpO2 96%    BMI 40.51 kg/m  BP 112/80    Pulse 95    Temp (!) 97.3 F (36.3 C)    Ht 5\' 6"  (1.676 m)    Wt 251 lb (113.9 kg)    SpO2 96%    BMI 40.51 kg/m    BP/Weight 01/23/2022 01/10/2022 06/14/4579  Systolic BP - 998 338  Diastolic BP - 88 86  Wt. (Lbs) 251 251 -  BMI 40.51 - -    Physical Exam Vitals reviewed.  Constitutional:      Appearance: Normal appearance.  Cardiovascular:     Rate and Rhythm: Normal rate and regular rhythm.     Pulses: Normal pulses.     Heart sounds: Normal heart sounds.  Pulmonary:     Effort: Pulmonary effort is normal.     Breath sounds: Normal breath sounds.  Abdominal:     General: Bowel sounds are normal.     Palpations: Abdomen is soft.  Musculoskeletal:        General: Normal range of motion.     Cervical back: Neck supple.  Skin:    General: Skin is warm and dry.     Capillary Refill: Capillary refill takes less than 2 seconds.     Findings: Rash (left knee) present.          Comments: Psoriasis plaque noted to left anterior knee  Neurological:     General: No focal deficit present.     Mental Status: She is alert and oriented to person, place, and time.  Psychiatric:        Mood and  Affect: Mood normal.        Behavior: Behavior normal.        Lab Results  Component Value Date   TSH 1.380 01/10/2022      Assessment & Plan:   1. Chronic allergic rhinitis - levocetirizine (XYZAL) 5 MG tablet; Take 1 tablet (5 mg total) by mouth every evening.  Dispense: 90 tablet; Refill: 2  2.  Hypokalemia - potassium chloride (KLOR-CON 10) 10 MEQ tablet; Take 1 tablet (10 mEq total) by mouth daily.  Dispense: 90 tablet; Refill: 1  3. Psoriasis - triamcinolone cream (KENALOG) 0.1 %; Apply 1 application topically 2 (two) times daily.  Dispense: 30 g; Refill: 0    Take Xyzal as needed for allergies Apply Triamcinolone to right knee psoriasis patch Continue Diovan as prescribed     Follow-up: 56-months, fasting  An After Visit Summary was printed and given to the patient.  I, Rip Harbour, NP, have reviewed all documentation for this visit. The documentation on 01/23/22 for the exam, diagnosis, procedures, and orders are all accurate and complete.    Signed, Rip Harbour, NP [place] [phone]

## 2022-01-23 NOTE — Patient Instructions (Addendum)
Take Xyzal as needed for allergies Continue Diovan as prescribed  Hypokalemia Hypokalemia means that the amount of potassium in the blood is lower than normal. Potassium is a chemical (electrolyte) that helps regulate the amount of fluid in the body. It also stimulates muscle tightening (contraction) and helps nerves work properly. Normally, most of the body's potassium is inside cells, and only a very small amount is in the blood. Because the amount in the blood is so small, minor changes to potassium levels in the blood can be life-threatening. What are the causes? This condition may be caused by: Antibiotic medicine. Diarrhea or vomiting. Taking too much of a medicine that helps you have a bowel movement (laxative) can cause diarrhea and lead to hypokalemia. Chronic kidney disease (CKD). Medicines that help the body get rid of excess fluid (diuretics). Eating disorders, such as bulimia. Low magnesium levels in the body. Sweating a lot. What are the signs or symptoms? Symptoms of this condition include: Weakness. Constipation. Fatigue. Muscle cramps. Mental confusion. Skipped heartbeats or irregular heartbeat (palpitations). Tingling or numbness. How is this diagnosed? This condition is diagnosed with a blood test. How is this treated? This condition may be treated by: Taking potassium supplements by mouth. Adjusting the medicines that you take. Eating more foods that contain a lot of potassium. If your potassium level is very low, you may need to get potassium through an IV and be monitored in the hospital. Follow these instructions at home:  Take over-the-counter and prescription medicines only as told by your health care provider. This includes vitamins and supplements. Eat a healthy diet. A healthy diet includes fresh fruits and vegetables, whole grains, healthy fats, and lean proteins. If instructed, eat more foods that contain a lot of potassium. This includes: Nuts, such  as peanuts and pistachios. Seeds, such as sunflower seeds and pumpkin seeds. Peas, lentils, and lima beans. Whole grain and bran cereals and breads. Fresh fruits and vegetables, such as apricots, avocado, bananas, cantaloupe, kiwi, oranges, tomatoes, asparagus, and potatoes. Orange juice. Tomato juice. Red meats. Yogurt. Keep all follow-up visits as told by your health care provider. This is important. Contact a health care provider if you: Have weakness that gets worse. Feel your heart pounding or racing. Vomit. Have diarrhea. Have diabetes (diabetes mellitus) and you have trouble keeping your blood sugar (glucose) in your target range. Get help right away if you: Have chest pain. Have shortness of breath. Have vomiting or diarrhea that lasts for more than 2 days. Faint. Summary Hypokalemia means that the amount of potassium in the blood is lower than normal. This condition is diagnosed with a blood test. Hypokalemia may be treated by taking potassium supplements, adjusting the medicines that you take, or eating more foods that are high in potassium. If your potassium level is very low, you may need to get potassium through an IV and be monitored in the hospital. This information is not intended to replace advice given to you by your health care provider. Make sure you discuss any questions you have with your health care provider. Document Revised: 07/01/2018 Document Reviewed: 07/02/2018 Elsevier Patient Education  [place]. Potassium Content of Foods Potassium is a mineral found in many foods and drinks. It affects how the heart works, and helps keep fluids and minerals balanced in the body. The amount of potassium you need each day depends on your age and any medical conditions you may have. Talk to your health care provider or dietitian about how much  potassium you need. The following lists of foods provide the general serving size for foods and the approximate amount  of potassium in each serving, listed in milligrams (mg). Actual values may vary depending on the product and how it is processed. High in potassium The following foods and beverages have 200 mg or more of potassium per serving: Apricots (raw) - 2 have 200 mg of potassium. Apricots (dry) - 5 have 200 mg of potassium. Artichoke - 1 medium has 345 mg of potassium. Avocado -  fruit has 245 mg of potassium. Banana - 1 medium fruit has 425 mg of potassium. [place] or baked beans (canned) -  cup has 280 mg of potassium. White beans (canned) -  cup has 595 mg potassium. Beef roast - 3 oz has 320 mg of potassium. Ground beef - 3 oz has 270 mg of potassium. Beets (raw or cooked) -  cup has 260 mg of potassium. Bran muffin - 2 oz has 300 mg of potassium. Broccoli (cooked) -  cup has 230 mg of potassium. Brussels sprouts -  cup has 250 mg of potassium. Cantaloupe -  cup has 215 mg of potassium. Cereal, 100% bran -  cup has 200-400 mg of potassium. Cheeseburger -1 single fast food burger has 225-400 mg of potassium. Chicken - 3 oz has 220 mg of potassium. Clams (canned) - 3 oz has 535 mg of potassium. Crab - 3 oz has 225 mg of potassium. Dates - 5 have 270 mg of potassium. Dried beans and peas -  cup has 300-475 mg of potassium. Figs (dried) - 2 have 260 mg of potassium. Fish (halibut, tuna, cod, snapper) - 3 oz has 480 mg of potassium. Fish (salmon, haddock, swordfish, perch) - 3 oz has 300 mg of potassium. Fish (tuna, canned) - 3 oz has 200 mg of potassium. [place] fries (fast food) - 3 oz has 470 mg of potassium. Granola with fruit and nuts -  cup has 200 mg of potassium. Grapefruit juice -  cup has 200 mg of potassium. Honeydew melon -  cup has 200 mg of potassium. Kale (raw) - 1 cup has 300 mg of potassium. Kiwi - 1 medium fruit has 240 mg of potassium. Kohlrabi, rutabaga, parsnips -  cup has 280 mg of potassium. Lentils -  cup has 365 mg of potassium. Mango - 1 each has 325 mg  of potassium. Milk (nonfat, low-fat, whole, buttermilk) - 1 cup has 350-380 mg of potassium. Milk (chocolate) - 1 cup has 420 mg of potassium Molasses - 1 Tbsp has 295 mg of potassium. Mushrooms -  cup has 280 mg of potassium. Nectarine - 1 each has 275 mg of potassium. Nuts (almonds, peanuts, hazelnuts, [place], cashew, mixed) - 1 oz has 200 mg of potassium. Nuts (pistachios) - 1 oz has 295 mg of potassium. Orange - 1 fruit has 240 mg of potassium. Orange juice -  cup has 235 mg of potassium. Papaya -  medium fruit has 390 mg of potassium. Peanut butter (chunky) - 2 Tbsp has 240 mg of potassium. Peanut butter (smooth) - 2 Tbsp has 210 mg of potassium. Pear - 1 medium (200 mg) of potassium. Pomegranate - 1 whole fruit has 400 mg of potassium. Pomegranate juice -  cup has 215 mg of potassium. Pork - 3 oz has 350 mg of potassium. Potato chips (salted) - 1 oz has 465 mg of potassium. Potato (baked with skin) - 1 medium has 925 mg of potassium. Potato (boiled) -  cup has 255 mg of potassium. Potato (Mashed) -  cup has 330 mg of potassium. Prune juice -  cup has 370 mg of potassium. Prunes - 5 have 305 mg of potassium. Pudding (chocolate) -  cup has 230 mg of potassium. Pumpkin (canned) -  cup has 250 mg of potassium. Raisins (seedless) -  cup has 270 mg of potassium. Seeds (sunflower or pumpkin) - 1 oz has 240 mg of potassium. Soy milk - 1 cup has 300 mg of potassium. Spinach (cooked) - 1/2 cup has 420 mg of potassium. Spinach (canned) -  cup has 370 mg of potassium. Sweet potato (baked with skin) - 1 medium has 450 mg of potassium. Swiss chard -  cup has 480 mg of potassium. Tomato or vegetable juice -  cup has 275 mg of potassium. Tomato (sauce or puree) -  cup has 400-550 mg of potassium. Tomato (raw) - 1 medium has 290 mg of potassium. Tomato (canned) -  cup has 200-300 mg of potassium. [place] - 3 oz has 250 mg of potassium. Wheat germ - 1 oz has 250 mg of  potassium. Winter squash -  cup has 250 mg of potassium. Yogurt (plain or fruited) - 6 oz has 260-435 mg of potassium. Zucchini -  cup has 220 mg of potassium. Moderate in potassium The following foods and beverages have 50-200 mg of potassium per serving: Apple - 1 fruit has 150 mg of potassium Apple juice -  cup has 150 mg of potassium Applesauce -  cup has 90 mg of potassium Apricot nectar -  cup has 140 mg of potassium Asparagus (small spears) -  cup has 155 mg of potassium Asparagus (large spears) - 6 have 155 mg of potassium Bagel (cinnamon raisin) - 1 four-inch bagel has 130 mg of potassium Bagel (egg or plain) - 1 four- inch bagel has 70 mg of potassium Beans (green) -  cup has 90 mg of potassium Beans (yellow) -  cup has 190 mg of potassium Beer, regular - 12 oz has 100 mg of potassium Beets (canned) -  cup has 125 mg of potassium Blackberries -  cup has 115 mg of potassium Blueberries -  cup has 60 mg of potassium Bread (whole wheat) - 1 slice has 70 mg of potassium Broccoli (raw) -  cup has 145 mg of potassium Cabbage -  cup has 150 mg of potassium Carrots (cooked or raw) -  cup has 180 mg of potassium Cauliflower (raw) -  cup has 150 mg of potassium Celery (raw) -  cup has 155 mg of potassium Cereal, bran flakes -  cup has 120-150 mg of potassium Cheese (cottage) -  cup has 110 mg of potassium Cherries - 10 have 150 mg of potassium Chocolate - 1 oz bar has 165 mg of potassium Coffee (brewed) - 6 oz has 90 mg of potassium Corn -  cup or 1 ear has 195 mg of potassium Cucumbers -  cup has 80 mg of potassium Egg - 1 large egg has 60 mg of potassium Eggplant -  cup has 60 mg of potassium Endive (raw) -  cup has 80 mg of potassium English muffin - 1 has 65 mg of potassium Fish (ocean perch) - 3 oz has 192 mg of potassium Frankfurter, beef or pork - 1 has 75 mg of potassium Fruit cocktail -  cup has 115 mg of potassium Grape juice -  cup has 170  mg of potassium Grapefruit -  fruit has  175 mg of potassium Grapes -  cup has 155 mg of potassium Greens: kale, turnip, collard -  cup has 110-150 mg of potassium Ice cream or frozen yogurt (chocolate) -  cup has 175 mg of potassium Ice cream or frozen yogurt (vanilla) -  cup has 120-150 mg of potassium Lemons, limes - 1 each has 80 mg of potassium Lettuce - 1 cup has 100 mg of potassium Mixed vegetables -  cup has 150 mg of potassium Mushrooms, raw -  cup has 110 mg of potassium Nuts (walnuts, pecans, or macadamia) - 1 oz has 125 mg of potassium Oatmeal -  cup has 80 mg of potassium Okra -  cup has 110 mg of potassium Onions -  cup has 120 mg of potassium Peach - 1 has 185 mg of potassium Peaches (canned) -  cup has 120 mg of potassium Pears (canned) -  cup has 120 mg of potassium Peas, green (frozen) -  cup has 90 mg of potassium Peppers (Green) -  cup has 130 mg of potassium Peppers (Red) -  cup has 160 mg of potassium Pineapple juice -  cup has 165 mg of potassium Pineapple (fresh or canned) -  cup has 100 mg of potassium Plums - 1 has 105 mg of potassium Pudding, vanilla -  cup has 150 mg of potassium Raspberries -  cup has 90 mg of potassium Rhubarb -  cup has 115 mg of potassium Rice, wild -  cup has 80 mg of potassium Shrimp - 3 oz has 155 mg of potassium Spinach (raw) - 1 cup has 170 mg of potassium Strawberries -  cup has 125 mg of potassium Summer squash -  cup has 175-200 mg of potassium Swiss chard (raw) - 1 cup has 135 mg of potassium Tangerines - 1 fruit has 140 mg of potassium Tea, brewed - 6 oz has 65 mg of potassium Turnips -  cup has 140 mg of potassium Watermelon -  cup has 85 mg of potassium Wine (Red, table) - 5 oz has 180 mg of potassium Wine (White, table) - 5 oz 100 mg of potassium Low in potassium The following foods and beverages have less than 50 mg of potassium per serving. Bread (white) - 1 slice has 30 mg of  potassium Carbonated beverages - 12 oz has less than 5 mg of potassium Cheese - 1 oz has 20-30 mg of potassium Cranberries -  cup has 45 mg of potassium Cranberry juice cocktail -  cup has 20 mg of potassium Fats and oils - 1 Tbsp has less than 5 mg of potassium Hummus - 1 Tbsp has 32 mg of potassium Nectar (papaya, mango, or pear) -  cup has 35 mg of potassium Rice (white or brown) -  cup has 50 mg of potassium Spaghetti or macaroni (cooked) -  cup has 30 mg of potassium Tortilla, flour or corn - 1 has 50 mg of potassium Waffle - 1 four-inch waffle has 50 mg of potassium Water chestnuts -  cup has 40 mg of potassium Summary Potassium is a mineral found in many foods and drinks. It affects how the heart works, and helps keep fluids and minerals balanced in the body. The amount of potassium you need each day depends on your age and any existing medical conditions you may have. Your health care provider or dietitian may recommend an amount of potassium that you should have each day. This information is not intended to replace advice given to  you by your health care provider. Make sure you discuss any questions you have with your health care provider. Document Revised: 11/01/2017 Document Reviewed: 02/13/2017 Elsevier Patient Education  [place].

## 2022-02-05 ENCOUNTER — Ambulatory Visit (HOSPITAL_COMMUNITY)
Admission: RE | Admit: 2022-02-05 | Discharge: 2022-02-05 | Disposition: A | Discharge status: Home / Self Care | Payer: 59 | Source: Ambulatory Visit | Attending: Nurse Practitioner | Admitting: Nurse Practitioner

## 2022-02-05 DIAGNOSIS — E041 Nontoxic single thyroid nodule: Secondary | ICD-10-CM | POA: Insufficient documentation

## 2022-02-05 MED ORDER — LIDOCAINE HCL (PF) 1 % IJ SOLN
INTRAMUSCULAR | Status: AC
  Filled 2022-02-05: qty 30

## 2022-02-07 ENCOUNTER — Telehealth: Payer: Self-pay | Admitting: Nurse Practitioner

## 2022-02-07 ENCOUNTER — Encounter: Payer: Self-pay | Admitting: Nurse Practitioner

## 2022-02-07 ENCOUNTER — Other Ambulatory Visit: Payer: Self-pay | Admitting: Nurse Practitioner

## 2022-02-07 DIAGNOSIS — R899 Unspecified abnormal finding in specimens from other organs, systems and tissues: Secondary | ICD-10-CM

## 2022-02-07 DIAGNOSIS — E041 Nontoxic single thyroid nodule: Secondary | ICD-10-CM

## 2022-02-07 LAB — CYTOLOGY - NON PAP

## 2022-02-07 NOTE — Telephone Encounter (Signed)
Telephoned patient to discuss preliminary thyroid nodule biopsy results. Referral sent to ENT. 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 Pt verbalized understanding of plan of care. ?

## 2022-02-20 ENCOUNTER — Telehealth: Payer: Self-pay | Admitting: Nurse Practitioner

## 2022-02-20 ENCOUNTER — Encounter (HOSPITAL_COMMUNITY): Payer: Self-pay

## 2022-02-20 NOTE — Telephone Encounter (Signed)
Attempted to call patient with thyroid nodule biopsy pathology report. No answer, left message for pt to call office tomorrow. ?

## 2022-02-21 ENCOUNTER — Encounter: Payer: Self-pay | Admitting: Nurse Practitioner

## 2022-03-12 ENCOUNTER — Encounter: Payer: Self-pay | Admitting: Nurse Practitioner

## 2022-04-03 ENCOUNTER — Other Ambulatory Visit: Payer: Self-pay | Admitting: Nurse Practitioner

## 2022-04-03 ENCOUNTER — Encounter: Payer: Self-pay | Admitting: Nurse Practitioner

## 2022-04-03 DIAGNOSIS — L409 Psoriasis, unspecified: Secondary | ICD-10-CM

## 2022-04-03 DIAGNOSIS — F331 Major depressive disorder, recurrent, moderate: Secondary | ICD-10-CM

## 2022-04-03 MED ORDER — SERTRALINE HCL 100 MG PO TABS
100.0000 mg | ORAL_TABLET | Freq: Every day | ORAL | 3 refills | Status: DC
Start: 2022-04-03 — End: 2023-02-18

## 2022-04-03 MED ORDER — TRIAMCINOLONE ACETONIDE 0.1 % EX CREA: 1.0000 "application " | TOPICAL_CREAM | Freq: Two times a day (BID) | CUTANEOUS | 0 refills | Status: DC

## 2022-04-16 ENCOUNTER — Other Ambulatory Visit: Payer: Self-pay | Admitting: Nurse Practitioner

## 2022-04-16 DIAGNOSIS — R030 Elevated blood-pressure reading, without diagnosis of hypertension: Secondary | ICD-10-CM

## 2022-04-16 DIAGNOSIS — I10 Essential (primary) hypertension: Secondary | ICD-10-CM

## 2022-04-23 NOTE — Progress Notes (Signed)
Cancelled.  

## 2022-04-24 ENCOUNTER — Ambulatory Visit (INDEPENDENT_AMBULATORY_CARE_PROVIDER_SITE_OTHER): Payer: 59 | Admitting: Nurse Practitioner

## 2022-04-24 DIAGNOSIS — E559 Vitamin D deficiency, unspecified: Secondary | ICD-10-CM

## 2022-04-24 DIAGNOSIS — F331 Major depressive disorder, recurrent, moderate: Secondary | ICD-10-CM

## 2022-04-24 DIAGNOSIS — I10 Essential (primary) hypertension: Secondary | ICD-10-CM

## 2022-04-24 DIAGNOSIS — E781 Pure hyperglyceridemia: Secondary | ICD-10-CM

## 2022-05-21 ENCOUNTER — Encounter: Payer: Self-pay | Admitting: Nurse Practitioner

## 2022-05-21 ENCOUNTER — Ambulatory Visit (INDEPENDENT_AMBULATORY_CARE_PROVIDER_SITE_OTHER): Payer: 59 | Admitting: Nurse Practitioner

## 2022-05-21 VITALS — BP 128/72 | HR 97 | Temp 97.4°F | Ht 66.75 in | Wt 260.0 lb

## 2022-05-21 DIAGNOSIS — F331 Major depressive disorder, recurrent, moderate: Secondary | ICD-10-CM | POA: Diagnosis not present

## 2022-05-21 DIAGNOSIS — L680 Hirsutism: Secondary | ICD-10-CM

## 2022-05-21 DIAGNOSIS — Z23 Encounter for immunization: Secondary | ICD-10-CM

## 2022-05-21 DIAGNOSIS — I10 Essential (primary) hypertension: Secondary | ICD-10-CM | POA: Diagnosis not present

## 2022-05-21 DIAGNOSIS — E8881 Metabolic syndrome: Secondary | ICD-10-CM | POA: Diagnosis not present

## 2022-05-21 DIAGNOSIS — E282 Polycystic ovarian syndrome: Secondary | ICD-10-CM | POA: Diagnosis not present

## 2022-05-21 DIAGNOSIS — Z6841 Body Mass Index (BMI) 40.0 and over, adult: Secondary | ICD-10-CM

## 2022-05-21 MED ORDER — SPIRONOLACTONE 50 MG PO TABS
50.0000 mg | ORAL_TABLET | Freq: Every day | ORAL | 0 refills | Status: DC
Start: 2022-05-21 — End: 2022-08-14

## 2022-05-21 MED ORDER — METFORMIN HCL 500 MG PO TABS: 500.0000 mg | ORAL_TABLET | Freq: Two times a day (BID) | ORAL | 3 refills | Status: DC

## 2022-05-21 NOTE — Patient Instructions (Addendum)
Begin Metformin 500 mg twice daily Eat low-carb diet Increase physical activity Stop Potassium supplement Begin Spironlactone 50 mg daily for hirsutism (facial hair) TDAP given in office today We will call you with lab results 4-week lab draw Follow-up in 71-month, fasting   Tdap (Tetanus, Diphtheria, Pertussis) Vaccine: What You Need to Know 1. Why get vaccinated? Tdap vaccine can prevent tetanus, diphtheria, and pertussis. Diphtheria and pertussis spread from person to person. Tetanus enters the body through cuts or wounds. TETANUS (T) causes painful stiffening of the muscles. Tetanus can lead to serious health problems, including being unable to open the mouth, having trouble swallowing and breathing, or death. DIPHTHERIA (D) can lead to difficulty breathing, heart failure, paralysis, or death. PERTUSSIS (aP), also known as "whooping cough," can cause uncontrollable, violent coughing that makes it hard to breathe, eat, or drink. Pertussis can be extremely serious especially in babies and young children, causing pneumonia, convulsions, brain damage, or death. In teens and adults, it can cause weight loss, loss of bladder control, passing out, and rib fractures from severe coughing. 2. Tdap vaccine Tdap is only for children 7 years and older, adolescents, and adults.  Adolescents should receive a single dose of Tdap, preferably at age 5260or 149years. Pregnant people should get a dose of Tdap during every pregnancy, preferably during the early part of the third trimester, to help protect the newborn from pertussis. Infants are most at risk for severe, life-threatening complications from pertussis. Adults who have never received Tdap should get a dose of Tdap. Also, adults should receive a booster dose of either Tdap or Td (a different vaccine that protects against tetanus and diphtheria but not pertussis) every 10 years, or after 5 years in the case of a severe or dirty wound or burn. Tdap  may be given at the same time as other vaccines. 3. Talk with your health care provider Tell your vaccine provider if the person getting the vaccine: Has had an allergic reaction after a previous dose of any vaccine that protects against tetanus, diphtheria, or pertussis, or has any severe, life-threatening allergies Has had a coma, decreased level of consciousness, or prolonged seizures within 7 days after a previous dose of any pertussis vaccine (DTP, DTaP, or Tdap) Has seizures or another nervous system problem Has ever had Guillain-Barr Syndrome (also called "GBS") Has had severe pain or swelling after a previous dose of any vaccine that protects against tetanus or diphtheria In some cases, your health care provider may decide to postpone Tdap vaccination until a future visit. People with minor illnesses, such as a cold, may be vaccinated. People who are moderately or severely ill should usually wait until they recover before getting Tdap vaccine.  Your health care provider can give you more information. 4. Risks of a vaccine reaction Pain, redness, or swelling where the shot was given, mild fever, headache, feeling tired, and nausea, vomiting, diarrhea, or stomachache sometimes happen after Tdap vaccination. People sometimes faint after medical procedures, including vaccination. Tell your provider if you feel dizzy or have vision changes or ringing in the ears.  As with any medicine, there is a very remote chance of a vaccine causing a severe allergic reaction, other serious injury, or death. 5. What if there is a serious problem? An allergic reaction could occur after the vaccinated person leaves the clinic. If you see signs of a severe allergic reaction (hives, swelling of the face and throat, difficulty breathing, a fast heartbeat, dizziness, or weakness), call  9-1-1 and get the person to the nearest hospital. For other signs that concern you, call your health care provider.  Adverse  reactions should be reported to the Vaccine Adverse Event Reporting System (VAERS). Your health care provider will usually file this report, or you can do it yourself. Visit the VAERS website at www.vaers.SamedayNews.es or call (618) 339-0051. VAERS is only for reporting reactions, and VAERS staff members do not give medical advice. 6. The National Vaccine Injury Compensation Program The [employer] Vaccine Injury Compensation Program (VICP) is a federal program that was created to compensate people who may have been injured by certain vaccines. Claims regarding alleged injury or death due to vaccination have a time limit for filing, which may be as short as two years. Visit the VICP website at GoldCloset.com.ee or call 816-487-4050 to learn about the program and about filing a claim. 7. How can I learn more? Ask your health care provider. Call your local or state health department. Visit the website of the Food and Drug Administration (FDA) for vaccine package inserts and additional information at TraderRating.uy. Contact the Centers for Disease Control and Prevention (CDC): Call 9133249547 (1-800-CDC-INFO) or Visit CDC's website at http://hunter.com/. Source: CDC Vaccine Information Statement Tdap (Tetanus, Diphtheria, Pertussis) Vaccine (07/08/2020) This same material is available at http://www.wolf.info/ for no charge. This information is not intended to replace advice given to you by your health care provider. Make sure you discuss any questions you have with your health care provider. Document Revised: 10/18/2021 Document Reviewed: 08/21/2021 Elsevier Patient Education  [place].    Insulin Resistance  Insulin is a hormone that is made by the pancreas. Insulin allows blood sugar (glucose) to enter the cells in the body. Insulin helps the body use glucose for energy. Normally, the body is insulin sensitive, which means the cells in the body are  effective at absorbing glucose. Insulin resistance is when the cells in the body do not respond properly to insulin and are not able to absorb glucose. The pancreas makes more insulin, but over time the body cannot make enough insulin to keep glucose at normal levels. Insulin resistance results in high blood glucose levels (hyperglycemia) and can lead to problems, including: Prediabetes. Type 2 diabetes (diabetes mellitus). Heart disease. High blood pressure (hypertension). Stroke. Polycystic ovary syndrome (PCOS). Nonalcoholic fatty liver disease. What are the causes? The exact cause of insulin resistance is not known. What increases the risk? The following factors may make you more likely to develop insulin resistance: Being overweight or obese, especially if a lot of your weight is in your waist area. Having an inactive (sedentary) lifestyle. Having above-normal glucose levels. Having abnormal cholesterol levels. Having sleep apnea. Being older than age 76. Using steroids. What are the signs or symptoms? This condition usually does not cause symptoms. A waist measurement of more than 35 inches (88.9 cm) for women and more than 40 inches (101.6 cm) for men may be a sign of insulin resistance. How is this diagnosed? There is no test to diagnose insulin resistance. However, your health care provider may diagnose insulin resistance based on: A physical exam. Your medical history. Blood tests that check your blood glucose level. How is this treated? Insulin resistance is treated with nutrition and lifestyle changes. These changes may include: Eating a healthy balance of nutritious foods. Getting more physical activity. Maintaining a healthy weight. Stopping the use of any tobacco products. Your health care provider will work with you to change your nutrition and lifestyle as needed.  In some cases, treatment may also include medicine to improve your insulin sensitivity. Follow these  instructions at home: Activity Be physically active. Do moderate-intensity physical activity for at least 30 minutes on 5 or more days of the week, or as told by your health care provider. This could include brisk walking, biking, or water aerobics. Ask your health care provider what activities are safe for you. A mix of physical activities may be best, such as walking, swimming, biking, and strength training. Eating and drinking  Follow a healthy meal plan. This includes eating: Lean proteins. Complex carbohydrates. Examples of these include whole grains, starchy vegetables (potatoes, corn, peas), and beans. Fresh fruits and vegetables. Low-fat dairy products. Healthy fats. Follow instructions from your health care provider about eating or drinking restrictions. Make an appointment to see a diet and nutrition specialist (registered dietitian) to help you create a healthy eating plan. General instructions Check your blood glucose levels as told by your health care provider. Take over-the-counter and prescription medicines only as told by your health care provider. Lose weight as told by your health care provider. Losing 5-7% of your body weight can reverse insulin resistance. Your health care provider can determine how much weight loss is best for you and can help you lose weight safely. Do not use any products that contain nicotine or tobacco. These products include cigarettes, chewing tobacco, and vaping devices, such as e-cigarettes. If you need help quitting, ask your health care provider. Keep all follow-up visits. This is important. Contact a health care provider if: You have trouble losing weight or maintaining your goal weight. You gain weight. You have trouble following your prescribed meal plan. You have trouble exercising more. Summary Insulin resistance occurs when cells in the body do not respond properly to insulin and are not able to absorb blood sugar (glucose). The body  makes more insulin, but over time the body cannot make enough insulin to keep blood sugar at normal levels. Insulin resistance is treated with nutrition and lifestyle changes, including eating a healthy balance of nutritious foods, getting more physical activity, and maintaining a healthy weight. Your health care provider will work with you to change your nutrition and lifestyle as needed. Treatment may also include medicine to improve your insulin sensitivity. Check your blood glucose levels as told by your health care provider. Keep all follow-up visits. This is important. This information is not intended to replace advice given to you by your health care provider. Make sure you discuss any questions you have with your health care provider. Document Revised: 08/08/2020 Document Reviewed: 08/08/2020 Elsevier Patient Education  [place] for Polycystic Ovary Syndrome Polycystic ovary syndrome (PCOS) is a common hormonal disorder that affects a woman's reproductive system. It can cause problems with menstrual periods and make it hard to get and stay pregnant. Changing what you eat can help your hormones reach normal levels, improve your health, and help you better manage PCOS. Following a balanced diet can help you lose weight and improve the way that your body uses the hormone insulin to control blood sugar. This may include: Eating low-fat (lean) proteins, complex carbohydrates, fresh fruits and vegetables, low-fat dairy products, healthy fats, and fiber. Cutting down on calories. Exercising regularly. What are tips for following this plan? Follow a balanced diet for meals and snacks. Eat breakfast, lunch, dinner, and one or two snacks every day. Include protein in each meal and snack. Choose whole grains instead of products that  are made with refined flour. Eat a variety of foods. Exercise regularly as told by your health care provider. Aim to do at least 30 minutes of  exercise on most days of the week. If you are overweight or obese: Pay attention to how many calories you eat. Cutting down on calories can help you lose weight. Work with your health care provider or a dietitian to figure out how many calories you need each day. What foods should I eat?  Fruits Include a variety of colors and types. All fruits are helpful for PCOS. Vegetables Include a variety of colors and types. All vegetables are helpful for PCOS. Grains Whole grains, such as whole wheat. Whole-grain breads, crackers, cereals, and pasta. Unsweetened oatmeal. Bulgur, barley, quinoa, and brown rice. Tortillas made from corn or whole-wheat flour. Meats and other proteins Lean proteins, such as fish, chicken, beans, eggs, and tofu. Dairy Low-fat dairy products, such as skim milk, cheese sticks, and yogurt. Beverages Low-fat or fat-free drinks, such as water, low-fat milk, sugar-free drinks, and small amounts of 100% fruit juice. Seasonings and condiments Ketchup. Mustard. Barbecue sauce. Relish. Low-fat or fat-free mayonnaise. Fats and oils Olive oil or canola oil. Walnuts and almonds. The items listed above may not be a complete list of recommended foods and beverages. Contact a dietitian for more options. What foods should I avoid? Foods that are high in calories or fat, especially saturated or trans fats. Fried foods. Sweets. Products that are made from refined white flour, including white bread, pastries, white rice, and pasta. The items listed above may not be a complete list of foods and beverages to avoid. Contact a dietitian for more information. Summary PCOS is a hormonal imbalance that affects a woman's reproductive system. It can cause problems with menstrual periods and make it hard to get and stay pregnant. You can help to manage your PCOS by exercising regularly and eating a healthy, varied diet of vegetables, fruit, whole grains, lean protein, and low-fat dairy  products. Changing what you eat can improve the way that your body uses insulin, help your hormones reach normal levels, and help you lose weight. This information is not intended to replace advice given to you by your health care provider. Make sure you discuss any questions you have with your health care provider. Document Revised: 04/28/2020 Document Reviewed: 04/28/2020 Elsevier Patient Education  [place].

## 2022-05-21 NOTE — Progress Notes (Signed)
Subjective:  Patient ID: Virginia Brock, female    DOB: [birth year]  Age: 41 y.o. MRN: [id number]  Chief Complaint  Patient presents with   Depression   Hypertension    HPI  Virginia Brock presents for chronic follow-up of HTN and depression. She has history of PCOS. She is concerned about weight gain, facial hair, and alopecia. Current weight 260 lbs, BMI 41.03. Reports posterior left knee pain that worsens with weight-bearing and increased physical activity. She has modified diet and increased physical activity.    Hypertension, follow-up:  She was last seen for hypertension 3 months ago.  BP at that visit was 112/80. Management includes valsartan.  She reports excellent compliance with treatment. She is not having side effects.  She is following a Regular diet. She is not exercising. She does not smoke.  Use of agents associated with hypertension: none.     Pertinent labs: No results found for: "CHOL", "HDL", "LDLCALC", "LDLDIRECT", "TRIG", "CHOLHDL" No results found for: "NA", "K", "CREATININE", "EGFR", "GFRNONAA", "GLUCOSE"      Depression, Follow-up  She  was last seen for this 3 months ago. Current treatment includes valsartan.   She reports excellent compliance with treatment. She is not having side effects. none  She reports excellent tolerance of treatment. Current symptoms include: difficulty concentrating and fatigue She feels she is improved since last visit.     05/21/2022   10:54 AM 01/10/2022   10:33 AM  Depression screen PHQ 2/9  Decreased Interest 1 2  Down, Depressed, Hopeless 1 2  PHQ - 2 Score 2 4  Altered sleeping 1 3  Tired, decreased energy 1 3  Change in appetite 0 0  Feeling bad or failure about yourself  1 3  Trouble concentrating 1 0  Moving slowly or fidgety/restless 0 1  Suicidal thoughts 0 0  PHQ-9 Score 6 14  Difficult doing work/chores Somewhat difficult Somewhat difficult      Current Outpatient Medications on File Prior  to Visit  Medication Sig Dispense Refill   acetaminophen (TYLENOL) 500 MG tablet Take 1,000 mg by mouth every 6 (six) hours as needed (for pain.).     ibuprofen (ADVIL) 200 MG tablet Take 800 mg by mouth every 8 (eight) hours as needed (for pain.).     levocetirizine (XYZAL) 5 MG tablet Take 1 tablet (5 mg total) by mouth every evening. 90 tablet 2   potassium chloride (KLOR-CON 10) 10 MEQ tablet Take 1 tablet (10 mEq total) by mouth daily. (Patient taking differently: Take 10 mEq by mouth every evening.) 90 tablet 1   sertraline (ZOLOFT) 100 MG tablet Take 1 tablet (100 mg total) by mouth daily. 90 tablet 3   triamcinolone cream (KENALOG) 0.1 % Apply 1 application. topically 2 (two) times daily. 30 g 0   valsartan (DIOVAN) 80 MG tablet Take 1 tablet (80 mg total) by mouth daily. 90 tablet 1   No current facility-administered medications on file prior to visit.   Past Medical History:  Diagnosis Date   Depression    Fibroid tumor    Right Breast Lumpectomy   Hypertension    Migraine    PCOS (polycystic ovarian syndrome)    Past Surgical History:  Procedure Laterality Date   BREAST LUMPECTOMY Right    Fibroid Tumor   CESAREAN SECTION      Family History  Problem Relation Age of Onset   Stroke Mother    Parkinson's disease Mother    Atrial fibrillation Mother  Cancer Father        breast, liver, spine and bone   Cancer Other        prostate, colon, liver, lung   Diabetes Other    Autism Child    Social History   Socioeconomic History   Marital status: Married    Spouse name: Marykathryn Carboni   Number of children: 2   Years of education: Not on file   Highest education level: Not on file  Occupational History   Not on file  Tobacco Use   Smoking status: Former    Packs/day: 0.50    Years: 4.00    Total pack years: 2.00    Types: Cigarettes    Quit date: 08/15/2013    Years since quitting: 8.7   Smokeless tobacco: Never  Substance and Sexual Activity   Alcohol  use: Yes   Drug use: Not Currently    Types: Marijuana    Comment: Used as a teenager   Sexual activity: Yes    Partners: Male    Birth control/protection: I.U.D.  Other Topics Concern   Not on file  Social History Narrative   ** Merged History Encounter **       Social Determinants of Health   Financial Resource Strain: Low Risk  (05/21/2022)   Overall Financial Resource Strain (CARDIA)    Difficulty of Paying Living Expenses: Not hard at all  Food Insecurity: No Food Insecurity (05/21/2022)   Hunger Vital Sign    Worried About Running Out of Food in the Last Year: Never true    Ran Out of Food in the Last Year: Never true  Transportation Needs: No Transportation Needs (05/21/2022)   PRAPARE - Hydrologist (Medical): No    Lack of Transportation (Non-Medical): No  Physical Activity: Inactive (05/21/2022)   Exercise Vital Sign    Days of Exercise per Week: 0 days    Minutes of Exercise per Session: 0 min  Stress: No Stress Concern Present (05/21/2022)   [place]    Feeling of Stress : Not at all  Social Connections: Moderately Isolated (05/21/2022)   Social Connection and Isolation Panel [NHANES]    Frequency of Communication with Friends and Family: More than three times a week    Frequency of Social Gatherings with Friends and Family: More than three times a week    Attends Religious Services: Never    Marine scientist or Organizations: No    Attends Archivist Meetings: Never    Marital Status: Married    Review of Systems  Constitutional:  Positive for fatigue and unexpected weight change (weight gain).  HENT:  Negative for congestion, ear pain, rhinorrhea and sore throat.   Respiratory:  Negative for cough.   Musculoskeletal:  Positive for arthralgias (left knee pain) and myalgias (left knee).  Skin:        Alopecia and hirsutism   Psychiatric/Behavioral:   Positive for decreased concentration.      Objective:  BP 128/72   Pulse 97   Temp (!) 97.4 F (36.3 C)   Ht 5' 6.75" (1.695 m)   Wt 260 lb (117.9 kg)   SpO2 99%   BMI 41.03 kg/m       01/23/2022    2:30 PM 01/10/2022   10:03 AM 07/15/2014    1:38 PM  BP/Weight  Systolic BP 503 546 568  Diastolic BP 80 88 86  Wt. (Lbs) 251 251   BMI 40.51 kg/m2      Physical Exam Vitals reviewed.  Constitutional:      Appearance: She is obese.  Cardiovascular:     Rate and Rhythm: Normal rate and regular rhythm.  Pulmonary:     Effort: Pulmonary effort is normal.     Breath sounds: Normal breath sounds.  Musculoskeletal:        General: Tenderness (left posterior knee) present.  Skin:    General: Skin is warm and dry.     Capillary Refill: Capillary refill takes less than 2 seconds.     Comments: Alopecia and hirsutism noted  Neurological:     General: No focal deficit present.     Mental Status: She is alert and oriented to person, place, and time.  Psychiatric:        Mood and Affect: Mood normal.        Behavior: Behavior normal.         Lab Results  Component Value Date   TSH 1.380 01/10/2022      Assessment & Plan:   1. Essential hypertension-well controlled - CBC with Differential/Platelet - Comprehensive metabolic panel - Lipid panel -continue Diovan as prescribed  2. Moderate recurrent major depression (HCC)-well controlled -continue Zoloft 100 mg QD  3. PCOS (polycystic ovarian syndrome) - metFORMIN (GLUCOPHAGE) 500 MG tablet; Take 1 tablet (500 mg total) by mouth 2 (two) times daily with a meal.  Dispense: 180 tablet; Refill: 3 - spironolactone (ALDACTONE) 50 MG tablet; Take 1 tablet (50 mg total) by mouth daily.  Dispense: 90 tablet; Refill: 0  4. Insulin resistance - metFORMIN (GLUCOPHAGE) 500 MG tablet; Take 1 tablet (500 mg total) by mouth 2 (two) times daily with a meal.  Dispense: 180 tablet; Refill: 3 - spironolactone (ALDACTONE) 50 MG tablet;  Take 1 tablet (50 mg total) by mouth daily.  Dispense: 90 tablet; Refill: 0 -CMP -CBC -low carb diet  5. Female hirsutism - spironolactone (ALDACTONE) 50 MG tablet; Take 1 tablet (50 mg total) by mouth daily.  Dispense: 90 tablet; Refill: 0  6. Need for vaccination - Tdap vaccine greater than or equal to 7yo IM  7. Class 3 severe obesity due to excess calories with serious comorbidity and body mass index (BMI) of 40.0 to 44.9 in adult (HCC) - CBC with Differential/Platelet - Comprehensive metabolic panel - Lipid panel   .  Begin Metformin 500 mg twice daily Eat low-carb diet Increase physical activity Stop Potassium supplement Begin Spironlactone 50 mg daily for hirsutism (facial hair) TDAP given in office today We will call you with lab results 4-week lab draw Follow-up in 62-month, fasting     Follow-up: 4-weeks for CMP; 372-monthollow-up  An After Visit Summary was printed and given to the patient.  I, ShRip HarbourNP, have reviewed all documentation for this visit. The documentation on 05/21/22 for the exam, diagnosis, procedures, and orders are all accurate and complete.   Signed, ShRip HarbourNP [phone]

## 2022-05-22 ENCOUNTER — Other Ambulatory Visit: Payer: Self-pay

## 2022-05-22 DIAGNOSIS — E782 Mixed hyperlipidemia: Secondary | ICD-10-CM

## 2022-05-22 LAB — COMPREHENSIVE METABOLIC PANEL WITH GFR
ALT: 22 IU/L (ref 0–32)
AST: 17 IU/L (ref 0–40)
Albumin/Globulin Ratio: 1.5 (ref 1.2–2.2)
Albumin: 4.2 g/dL (ref 3.8–4.8)
Alkaline Phosphatase: 91 IU/L (ref 44–121)
BUN/Creatinine Ratio: 24 — ABNORMAL HIGH (ref 9–23)
BUN: 17 mg/dL (ref 6–20)
Bilirubin Total: 0.3 mg/dL (ref 0.0–1.2)
CO2: 20 mmol/L (ref 20–29)
Calcium: 9.2 mg/dL (ref 8.7–10.2)
Chloride: 108 mmol/L — ABNORMAL HIGH (ref 96–106)
Creatinine, Ser: 0.7 mg/dL (ref 0.57–1.00)
Globulin, Total: 2.8 g/dL (ref 1.5–4.5)
Glucose: 133 mg/dL — ABNORMAL HIGH (ref 70–99)
Potassium: 4.1 mmol/L (ref 3.5–5.2)
Sodium: 142 mmol/L (ref 134–144)
Total Protein: 7 g/dL (ref 6.0–8.5)
eGFR: 113 mL/min/1.73

## 2022-05-22 LAB — CBC WITH DIFFERENTIAL/PLATELET
Basophils Absolute: 0 10*3/uL (ref 0.0–0.2)
Basos: 1 %
EOS (ABSOLUTE): 0.3 10*3/uL (ref 0.0–0.4)
Eos: 5 %
Hematocrit: 44.5 % (ref 34.0–46.6)
Hemoglobin: 15 g/dL (ref 11.1–15.9)
Immature Grans (Abs): 0 10*3/uL (ref 0.0–0.1)
Immature Granulocytes: 0 %
Lymphocytes Absolute: 1.8 10*3/uL (ref 0.7–3.1)
Lymphs: 30 %
MCH: 29.8 pg (ref 26.6–33.0)
MCHC: 33.7 g/dL (ref 31.5–35.7)
MCV: 88 fL (ref 79–97)
Monocytes Absolute: 0.6 10*3/uL (ref 0.1–0.9)
Monocytes: 9 %
Neutrophils Absolute: 3.3 10*3/uL (ref 1.4–7.0)
Neutrophils: 55 %
Platelets: 236 10*3/uL (ref 150–450)
RBC: 5.04 x10E6/uL (ref 3.77–5.28)
RDW: 12.3 % (ref 11.7–15.4)
WBC: 6 10*3/uL (ref 3.4–10.8)

## 2022-05-22 LAB — LIPID PANEL
Chol/HDL Ratio: 5.6 ratio — ABNORMAL HIGH (ref 0.0–4.4)
Cholesterol, Total: 203 mg/dL — ABNORMAL HIGH (ref 100–199)
HDL: 36 mg/dL — ABNORMAL LOW
LDL Chol Calc (NIH): 126 mg/dL — ABNORMAL HIGH (ref 0–99)
Triglycerides: 232 mg/dL — ABNORMAL HIGH (ref 0–149)
VLDL Cholesterol Cal: 41 mg/dL — ABNORMAL HIGH (ref 5–40)

## 2022-05-22 LAB — CARDIOVASCULAR RISK ASSESSMENT

## 2022-05-22 MED ORDER — ROSUVASTATIN CALCIUM 5 MG PO TABS: 5.0000 mg | ORAL_TABLET | Freq: Every day | ORAL | 0 refills | Status: DC

## 2022-05-22 NOTE — Progress Notes (Signed)
Patient is willing to start crestor. Sending to pharmacy.

## 2022-06-18 ENCOUNTER — Other Ambulatory Visit: Payer: 59

## 2022-06-18 ENCOUNTER — Other Ambulatory Visit: Payer: Self-pay | Admitting: Nurse Practitioner

## 2022-06-18 DIAGNOSIS — R7309 Other abnormal glucose: Secondary | ICD-10-CM

## 2022-06-18 LAB — HEMOGLOBIN A1C
Est. average glucose Bld gHb Est-mCnc: 111 mg/dL
Hgb A1c MFr Bld: 5.5 % (ref 4.8–5.6)

## 2022-07-13 ENCOUNTER — Other Ambulatory Visit: Payer: Self-pay | Admitting: Nurse Practitioner

## 2022-07-13 DIAGNOSIS — E876 Hypokalemia: Secondary | ICD-10-CM

## 2022-07-23 NOTE — Telephone Encounter (Signed)
error 

## 2022-08-13 ENCOUNTER — Other Ambulatory Visit: Payer: Self-pay | Admitting: Nurse Practitioner

## 2022-08-13 DIAGNOSIS — E282 Polycystic ovarian syndrome: Secondary | ICD-10-CM

## 2022-08-13 DIAGNOSIS — E8881 Metabolic syndrome: Secondary | ICD-10-CM

## 2022-08-13 DIAGNOSIS — L680 Hirsutism: Secondary | ICD-10-CM

## 2022-08-13 DIAGNOSIS — E782 Mixed hyperlipidemia: Secondary | ICD-10-CM

## 2022-08-28 ENCOUNTER — Ambulatory Visit: Payer: 59 | Admitting: Nurse Practitioner

## 2022-09-06 ENCOUNTER — Ambulatory Visit: Payer: BC Managed Care – PPO | Admitting: Nurse Practitioner

## 2022-09-06 ENCOUNTER — Encounter: Payer: Self-pay | Admitting: Nurse Practitioner

## 2022-09-06 VITALS — BP 110/64 | HR 99 | Temp 97.1°F | Ht 66.0 in | Wt 270.0 lb

## 2022-09-06 DIAGNOSIS — Z23 Encounter for immunization: Secondary | ICD-10-CM | POA: Diagnosis not present

## 2022-09-06 DIAGNOSIS — F331 Major depressive disorder, recurrent, moderate: Secondary | ICD-10-CM

## 2022-09-06 DIAGNOSIS — J309 Allergic rhinitis, unspecified: Secondary | ICD-10-CM

## 2022-09-06 DIAGNOSIS — Z8249 Family history of ischemic heart disease and other diseases of the circulatory system: Secondary | ICD-10-CM

## 2022-09-06 DIAGNOSIS — R000 Tachycardia, unspecified: Secondary | ICD-10-CM

## 2022-09-06 DIAGNOSIS — R110 Nausea: Secondary | ICD-10-CM

## 2022-09-06 DIAGNOSIS — E782 Mixed hyperlipidemia: Secondary | ICD-10-CM

## 2022-09-06 DIAGNOSIS — Z6841 Body Mass Index (BMI) 40.0 and over, adult: Secondary | ICD-10-CM

## 2022-09-06 DIAGNOSIS — I10 Essential (primary) hypertension: Secondary | ICD-10-CM

## 2022-09-06 DIAGNOSIS — E282 Polycystic ovarian syndrome: Secondary | ICD-10-CM

## 2022-09-06 DIAGNOSIS — E88819 Insulin resistance, unspecified: Secondary | ICD-10-CM

## 2022-09-06 DIAGNOSIS — J018 Other acute sinusitis: Secondary | ICD-10-CM

## 2022-09-06 MED ORDER — SEMAGLUTIDE-WEIGHT MANAGEMENT 0.25 MG/0.5ML ~~LOC~~ SOAJ: 0.2500 mg | SUBCUTANEOUS | 0 refills | Status: DC

## 2022-09-06 MED ORDER — SEMAGLUTIDE-WEIGHT MANAGEMENT 1.7 MG/0.75ML ~~LOC~~ SOAJ
1.7000 mg | SUBCUTANEOUS | 0 refills | Status: DC
Start: 2022-12-02 — End: 2023-02-06

## 2022-09-06 MED ORDER — AZITHROMYCIN 250 MG PO TABS: ORAL_TABLET | ORAL | 0 refills | Status: AC

## 2022-09-06 MED ORDER — ONDANSETRON HCL 4 MG PO TABS: 4.0000 mg | ORAL_TABLET | Freq: Three times a day (TID) | ORAL | 0 refills | Status: DC | PRN

## 2022-09-06 MED ORDER — WEGOVY 0.25 MG/0.5ML ~~LOC~~ SOAJ: 0.2500 mg | SUBCUTANEOUS | 0 refills | Status: DC

## 2022-09-06 MED ORDER — SEMAGLUTIDE-WEIGHT MANAGEMENT 0.5 MG/0.5ML ~~LOC~~ SOAJ: 0.5000 mg | SUBCUTANEOUS | 0 refills | Status: DC

## 2022-09-06 MED ORDER — SEMAGLUTIDE-WEIGHT MANAGEMENT 2.4 MG/0.75ML ~~LOC~~ SOAJ: 2.4000 mg | SUBCUTANEOUS | 0 refills | Status: DC

## 2022-09-06 MED ORDER — SEMAGLUTIDE-WEIGHT MANAGEMENT 1 MG/0.5ML ~~LOC~~ SOAJ: 1.0000 mg | SUBCUTANEOUS | 0 refills | Status: DC

## 2022-09-06 MED ORDER — FLUTICASONE PROPIONATE 50 MCG/ACT NA SUSP: 2.0000 | Freq: Every day | NASAL | 6 refills | Status: DC

## 2022-09-06 NOTE — Progress Notes (Signed)
Subjective:  Patient ID: Virginia Brock, female    DOB: July 26, 1981  Age: 41 y.o. MRN: [id number]  Chief Complaint  Patient presents with   Hypertension     HPI    Gene presents for chronic follow-up of HTN, hyperlipidemia, and depression.  She has history of PCOS. She is concerned about weight gain, facial hair, and alopecia. Current weight 260 lbs, BMI 41.03. Reports posterior left knee pain that worsens with weight-bearing and increased physical activity. She has modified diet and increased physical activity.    Hypertension, follow-up: She was last seen for hypertension 4 months ago.  BP at that visit was 128/72. Management includes valsartan and spironolactone. She reports excellent compliance with treatment. She is not having side effects.  She is following a Regular diet. She is not exercising. She does not smoke.     Use of agents associated with hypertension: none.    Pertinent labs: Lab Results  Component Value Date   CHOL 203 (H) 05/21/2022   HDL 36 (L) 05/21/2022   LDLCALC 126 (H) 05/21/2022   TRIG 232 (H) 05/21/2022   CHOLHDL 5.6 (H) 05/21/2022   Lab Results  Component Value Date   NA 142 05/21/2022   K 4.1 05/21/2022   CREATININE 0.70 05/21/2022   EGFR 113 05/21/2022   GLUCOSE 133 (H) 05/21/2022      Lipid/Cholesterol, Follow-up  Last lipid panel Other pertinent labs  Lab Results  Component Value Date   CHOL 203 (H) 05/21/2022   HDL 36 (L) 05/21/2022   LDLCALC 126 (H) 05/21/2022   TRIG 232 (H) 05/21/2022   CHOLHDL 5.6 (H) 05/21/2022   Lab Results  Component Value Date   ALT 22 05/21/2022   AST 17 05/21/2022   PLT 236 05/21/2022   TSH 1.380 01/10/2022     Management includes crestor.         She reports excellent compliance with treatment. She is not having side effects.   Depression, Follow-up  Current treatment includes zoloft.  She reports excellent compliance with treatment. She is not having side effects.  She reports  good tolerance of treatment. Current symptoms include: fatigue She feels she is worse since last visit.     09/06/2022    3:50 PM 05/21/2022   10:54 AM 01/10/2022   10:33 AM  Depression screen PHQ 2/9  Decreased Interest 0 1 2  Down, Depressed, Hopeless 0 1 2  PHQ - 2 Score 0 2 4  Altered sleeping 3 1 3   Tired, decreased energy 1 1 3   Change in appetite 3 0 0  Feeling bad or failure about yourself  0 1 3  Trouble concentrating 1 1 0  Moving slowly or fidgety/restless 3 0 1  Suicidal thoughts 0 0 0  PHQ-9 Score 11 6 14   Difficult doing work/chores Not difficult at all Somewhat difficult Somewhat difficult    Current Outpatient Medications on File Prior to Visit  Medication Sig Dispense Refill   acetaminophen (TYLENOL) 500 MG tablet Take 1,000 mg by mouth every 6 (six) hours as needed (for pain.).     ibuprofen (ADVIL) 200 MG tablet Take 800 mg by mouth every 8 (eight) hours as needed (for pain.).     levocetirizine (XYZAL) 5 MG tablet Take 1 tablet (5 mg total) by mouth every evening. 90 tablet 2   metFORMIN (GLUCOPHAGE) 500 MG tablet Take 1 tablet (500 mg total) by mouth 2 (two) times daily with a meal. 180 tablet 3   rosuvastatin (  CRESTOR) 5 MG tablet TAKE 1 TABLET BY MOUTH ONCE DAILY 90 tablet 0   sertraline (ZOLOFT) 100 MG tablet Take 1 tablet (100 mg total) by mouth daily. 90 tablet 3   spironolactone (ALDACTONE) 50 MG tablet TAKE 1 TABLET BY MOUTH ONCE DAILY 90 tablet 0   triamcinolone cream (KENALOG) 0.1 % Apply 1 application. topically 2 (two) times daily. 30 g 0   valsartan (DIOVAN) 80 MG tablet Take 1 tablet (80 mg total) by mouth daily. 90 tablet 1   No current facility-administered medications on file prior to visit.   Past Medical History:  Diagnosis Date   Depression    Fibroid tumor    Right Breast Lumpectomy   Hypertension    Migraine    PCOS (polycystic ovarian syndrome)    Past Surgical History:  Procedure Laterality Date   BREAST LUMPECTOMY Right     Fibroid Tumor   CESAREAN SECTION      Family History  Problem Relation Age of Onset   Stroke Mother    Parkinson's disease Mother    Atrial fibrillation Mother    Cancer Father        breast, liver, spine and bone   Cancer Other        prostate, colon, liver, lung   Diabetes Other    Autism Child    Social History   Socioeconomic History   Marital status: Married    Spouse name: Jazzmin Newbold   Number of children: 2   Years of education: Not on file   Highest education level: Not on file  Occupational History   Not on file  Tobacco Use   Smoking status: Former    Packs/day: 0.50    Years: 4.00    Total pack years: 2.00    Types: Cigarettes    Quit date: 08/15/2013    Years since quitting: 9.0   Smokeless tobacco: Never  Substance and Sexual Activity   Alcohol use: Yes   Drug use: Not Currently    Types: Marijuana    Comment: Used as a teenager   Sexual activity: Yes    Partners: Male    Birth control/protection: I.U.D.  Other Topics Concern   Not on file  Social History Narrative   ** Merged History Encounter **       Social Determinants of Health   Financial Resource Strain: Low Risk  (05/21/2022)   Overall Financial Resource Strain (CARDIA)    Difficulty of Paying Living Expenses: Not hard at all  Food Insecurity: No Food Insecurity (05/21/2022)   Hunger Vital Sign    Worried About Running Out of Food in the Last Year: Never true    Ran Out of Food in the Last Year: Never true  Transportation Needs: No Transportation Needs (05/21/2022)   PRAPARE - Hydrologist (Medical): No    Lack of Transportation (Non-Medical): No  Physical Activity: Inactive (05/21/2022)   Exercise Vital Sign    Days of Exercise per Week: 0 days    Minutes of Exercise per Session: 0 min  Stress: No Stress Concern Present (05/21/2022)   [place]    Feeling of Stress : Not at all  Social  Connections: Moderately Isolated (05/21/2022)   Social Connection and Isolation Panel [NHANES]    Frequency of Communication with Friends and Family: More than three times a week    Frequency of Social Gatherings with Friends and Family: More  than three times a week    Attends Religious Services: Never    Active Member of Clubs or Organizations: No    Attends Archivist Meetings: Never    Marital Status: Married    Review of Systems  Constitutional:  Negative for chills, fatigue and fever.  HENT:  Positive for postnasal drip. Negative for congestion, ear pain, rhinorrhea and sore throat.   Respiratory:  Positive for cough. Negative for shortness of breath.   Cardiovascular:  Negative for chest pain.  Gastrointestinal:  Negative for abdominal pain, constipation, diarrhea, nausea and vomiting.  Genitourinary:  Negative for dysuria and urgency.  Musculoskeletal:  Negative for back pain and myalgias.  Neurological:  Negative for dizziness, weakness, light-headedness and headaches.  Psychiatric/Behavioral:  Negative for dysphoric mood. The patient is not nervous/anxious.      Objective:  BP 110/64   Pulse 99   Temp (!) 97.1 F (36.2 C)   Ht 5' 6"  (1.676 m)   Wt 270 lb (122.5 kg)   SpO2 98%   BMI 43.58 kg/m      09/06/2022    3:44 PM 05/21/2022   11:02 AM 01/23/2022    2:30 PM  BP/Weight  Systolic BP 165 537 482  Diastolic BP 64 72 80  Wt. (Lbs) 270 260 251  BMI 43.58 kg/m2 41.03 kg/m2 40.51 kg/m2    Physical Exam Vitals reviewed.  Constitutional:      Appearance: Normal appearance.  HENT:     Head: Normocephalic.     Right Ear: Tympanic membrane normal.     Left Ear: Tympanic membrane normal.     Nose: Nose normal.     Mouth/Throat:     Mouth: Mucous membranes are moist.  Eyes:     Pupils: Pupils are equal, round, and reactive to light.  Cardiovascular:     Rate and Rhythm: Normal rate and regular rhythm.  Pulmonary:     Effort: Pulmonary effort is  normal.     Breath sounds: Normal breath sounds.  Abdominal:     General: Bowel sounds are normal.     Palpations: Abdomen is soft.  Musculoskeletal:        General: Normal range of motion.     Cervical back: Neck supple.  Skin:    General: Skin is warm and dry.     Capillary Refill: Capillary refill takes less than 2 seconds.  Neurological:     General: No focal deficit present.     Mental Status: She is alert and oriented to person, place, and time.  Psychiatric:        Mood and Affect: Mood normal.        Behavior: Behavior normal.   Lab Results  Component Value Date   WBC 6.0 05/21/2022   HGB 15.0 05/21/2022   HCT 44.5 05/21/2022   PLT 236 05/21/2022   GLUCOSE 133 (H) 05/21/2022   CHOL 203 (H) 05/21/2022   TRIG 232 (H) 05/21/2022   HDL 36 (L) 05/21/2022   LDLCALC 126 (H) 05/21/2022   ALT 22 05/21/2022   AST 17 05/21/2022   NA 142 05/21/2022   K 4.1 05/21/2022   CL 108 (H) 05/21/2022   CREATININE 0.70 05/21/2022   BUN 17 05/21/2022   CO2 20 05/21/2022   TSH 1.380 01/10/2022   HGBA1C 5.5 06/18/2022      Assessment & Plan:   1. Mixed hyperlipidemia-not at goal - CBC with Differential/Platelet; Future - Comprehensive metabolic panel; Future - Lipid panel;  Future - VITAMIN D 25 Hydroxy (Vit-D Deficiency, Fractures); Future - Ambulatory referral to Cardiology -continue Crestor 5 mg QD  2. Essential hypertension-well controlled  - T4, free - TSH; Future - Ambulatory referral to Cardiology -continue Diovan 80 mg QD  3. Chronic allergic rhinitis - fluticasone (FLONASE) 50 MCG/ACT nasal spray; Place 2 sprays into both nostrils daily.  Dispense: 16 g; Refill: 6  4. Tachycardia - Ambulatory referral to Cardiology  5. Nausea - ondansetron (ZOFRAN) 4 MG tablet; Take 1 tablet (4 mg total) by mouth every 8 (eight) hours as needed for nausea or vomiting.  Dispense: 20 tablet; Refill: 0  6. Need for immunization against influenza - Flu Vaccine QUAD 34moIM  (Fluarix, Fluzone & Alfiuria Quad PF)  7. Acute non-recurrent sinusitis of other sinus - azithromycin (ZITHROMAX) 250 MG tablet; Take 2 tablets on day 1, then 1 tablet daily on days 2 through 5  Dispense: 6 tablet; Refill: 0  8. Family history of atrial fibrillation - Ambulatory referral to Cardiology  9. Class 3 severe obesity due to excess calories with serious comorbidity and body mass index (BMI) of 40.0 to 44.9 in adult ([facility] - Semaglutide-Weight Management 0.25 MG/0.5ML SOAJ; Inject 0.25 mg into the skin once a week for 28 days.  Dispense: 2 mL; Refill: 0 - Semaglutide-Weight Management 0.5 MG/0.5ML SOAJ; Inject 0.5 mg into the skin once a week for 28 days.  Dispense: 2 mL; Refill: 0 - Semaglutide-Weight Management 1 MG/0.5ML SOAJ; Inject 1 mg into the skin once a week for 28 days.  Dispense: 2 mL; Refill: 0 - Semaglutide-Weight Management 1.7 MG/0.75ML SOAJ; Inject 1.7 mg into the skin once a week for 28 days.  Dispense: 3 mL; Refill: 0 - Semaglutide-Weight Management 2.4 MG/0.75ML SOAJ; Inject 2.4 mg into the skin once a week for 28 days.  Dispense: 3 mL; Refill: 0 - Semaglutide-Weight Management (WEGOVY) 0.25 MG/0.5ML SOAJ; Inject 0.25 mg into the skin once a week.  Dispense: 2 mL; Refill: 0 - Ambulatory referral to Cardiology  10. Moderate recurrent major depression (HCC)-well controlled -continue Zoloft 100 mg QD    Begin Wegovy 0.25 mg injection weekly for 4 weeks, then increase to 0.5 mg injection for 4 weeks, then increase to 1 mg injection weekly for 4 weeks, then increase to 1.7 mg injection weekly for 4 weeks, then increase to 2 .4 mg injection weekly  Take Zofran 4 mg as needed for nausea  Return Nov 7th, 2023 for follow-up   Follow-up: 10/09/22  An After Visit Summary was printed and given to the patient.  I, SRip Harbour NP, have reviewed all documentation for this visit. The documentation on 09/11/22 for the exam, diagnosis, procedures, and orders are all  accurate and complete.    Signed, SRip Harbour NP [facility]([phone]

## 2022-09-06 NOTE — Patient Instructions (Addendum)
Begin Wegovy 0.25 mg injection weekly for 4 weeks, then increase to 0.5 mg injection for 4 weeks, then increase to 1 mg injection weekly for 4 weeks, then increase to 1.7 mg injection weekly for 4 weeks, then increase to 2 .4 mg injection weekly  Take Zofran 4 mg as needed for nausea  Return Nov 7th, 2023 for follow-up   Semaglutide Injection (Weight Management) What is this medication? SEMAGLUTIDE (SEM a GLOO tide) promotes weight loss. It may also be used to maintain weight loss. It works by decreasing appetite. Changes to diet and exercise are often combined with this medication. This medicine may be used for other purposes; ask your health care provider or pharmacist if you have questions. COMMON BRAND NAME(S): SEGBTD What should I tell my care team before I take this medication? They need to know if you have any of these conditions: Endocrine tumors (MEN 2) or if someone in your family had these tumors Eye disease, vision problems Gallbladder disease History of depression or mental health disease History of pancreatitis Kidney disease Stomach or intestine problems Suicidal thoughts, plans, or attempt; a previous suicide attempt by you or a family member Thyroid cancer or if someone in your family had thyroid cancer An unusual or allergic reaction to semaglutide, other medications, foods, dyes, or preservatives Pregnant or trying to get pregnant Breast-feeding How should I use this medication? This medication is injected under the skin. You will be taught how to prepare and give it. Take it as directed on the prescription label. It is given once every week (every 7 days). Keep taking it unless your care team tells you to stop. It is important that you put your used needles and pens in a special sharps container. Do not put them in a trash can. If you do not have a sharps container, call your pharmacist or care team to get one. A special MedGuide will be given to you by the pharmacist  with each prescription and refill. Be sure to read this information carefully each time. This medication comes with INSTRUCTIONS FOR USE. Ask your pharmacist for directions on how to use this medication. Read the information carefully. Talk to your pharmacist or care team if you have questions. Talk to your care team about the use of this medication in children. While it may be prescribed for children as young as 12 years for selected conditions, precautions do apply. Overdosage: If you think you have taken too much of this medicine contact a poison control center or emergency room at once. NOTE: This medicine is only for you. Do not share this medicine with others. What if I miss a dose? If you miss a dose and the next scheduled dose is more than 2 days away, take the missed dose as soon as possible. If you miss a dose and the next scheduled dose is less than 2 days away, do not take the missed dose. Take the next dose at your regular time. Do not take double or extra doses. If you miss your dose for 2 weeks or more, take the next dose at your regular time or call your care team to talk about how to restart this medication. What may interact with this medication? Insulin and other medications for diabetes This list may not describe all possible interactions. Give your health care provider a list of all the medicines, herbs, non-prescription drugs, or dietary supplements you use. Also tell them if you smoke, drink alcohol, or use illegal drugs. Some  items may interact with your medicine. What should I watch for while using this medication? Visit your care team for regular checks on your progress. It may be some time before you see the benefit from this medication. Drink plenty of fluids while taking this medication. Check with your care team if you have severe diarrhea, nausea, and vomiting, or if you sweat a lot. The loss of too much body fluid may make it dangerous for you to take this  medication. This medication may affect blood sugar levels. Ask your care team if changes in diet or medications are needed if you have diabetes. If you or your family notice any changes in your behavior, such as new or worsening depression, thoughts of harming yourself, anxiety, other unusual or disturbing thoughts, or memory loss, call your care team right away. Women should inform their care team if they wish to become pregnant or think they might be pregnant. Losing weight while pregnant is not advised and may cause harm to the unborn child. Talk to your care team for more information. What side effects may I notice from receiving this medication? Side effects that you should report to your care team as soon as possible: Allergic reactions--skin rash, itching, hives, swelling of the face, lips, tongue, or throat Change in vision Dehydration--increased thirst, dry mouth, feeling faint or lightheaded, headache, dark yellow or brown urine Gallbladder problems--severe stomach pain, nausea, vomiting, fever Heart palpitations--rapid, pounding, or irregular heartbeat Kidney injury--decrease in the amount of urine, swelling of the ankles, hands, or feet Pancreatitis--severe stomach pain that spreads to your back or gets worse after eating or when touched, fever, nausea, vomiting Thoughts of suicide or self-harm, worsening mood, feelings of depression Thyroid cancer--new mass or lump in the neck, pain or trouble swallowing, trouble breathing, hoarseness Side effects that usually do not require medical attention (report to your care team if they continue or are bothersome): Diarrhea Loss of appetite Nausea Stomach pain Vomiting This list may not describe all possible side effects. Call your doctor for medical advice about side effects. You may report side effects to FDA at 1-800-FDA-1088. Where should I keep my medication? Keep out of the reach of children and pets. Refrigeration (preferred): Store  in the refrigerator. Do not freeze. Keep this medication in the original container until you are ready to take it. Get rid of any unused medication after the expiration date. Room temperature: If needed, prior to cap removal, the pen can be stored at room temperature for up to 28 days. Protect from light. If it is stored at room temperature, get rid of any unused medication after 28 days or after it expires, whichever is first. It is important to get rid of the medication as soon as you no longer need it or it is expired. You can do this in two ways: Take the medication to a medication take-back program. Check with your pharmacy or law enforcement to find a location. If you cannot return the medication, follow the directions in the [place]. NOTE: This sheet is a summary. It may not cover all possible information. If you have questions about this medicine, talk to your doctor, pharmacist, or health care provider.  2023 Elsevier/Gold Standard (2021-02-02 00:00:00)

## 2022-09-10 ENCOUNTER — Other Ambulatory Visit: Payer: Self-pay | Admitting: Nurse Practitioner

## 2022-09-10 DIAGNOSIS — E782 Mixed hyperlipidemia: Secondary | ICD-10-CM

## 2022-09-11 ENCOUNTER — Encounter: Payer: Self-pay | Admitting: Nurse Practitioner

## 2022-09-11 ENCOUNTER — Other Ambulatory Visit: Payer: Self-pay | Admitting: Nurse Practitioner

## 2022-09-11 DIAGNOSIS — E782 Mixed hyperlipidemia: Secondary | ICD-10-CM

## 2022-09-12 MED ORDER — ROSUVASTATIN CALCIUM 5 MG PO TABS: 5.0000 mg | ORAL_TABLET | Freq: Every day | ORAL | 0 refills | Status: DC

## 2022-10-09 ENCOUNTER — Other Ambulatory Visit: Payer: BC Managed Care – PPO

## 2022-10-09 DIAGNOSIS — I10 Essential (primary) hypertension: Secondary | ICD-10-CM

## 2022-10-09 DIAGNOSIS — E782 Mixed hyperlipidemia: Secondary | ICD-10-CM

## 2022-10-10 LAB — CARDIOVASCULAR RISK ASSESSMENT

## 2022-10-10 LAB — LIPID PANEL
Chol/HDL Ratio: 3.3 ratio (ref 0.0–4.4)
Cholesterol, Total: 137 mg/dL (ref 100–199)
HDL: 42 mg/dL (ref >39)
LDL Chol Calc (NIH): 71 mg/dL (ref 0–99)
Triglycerides: 138 mg/dL (ref 0–149)
VLDL Cholesterol Cal: 24 mg/dL (ref 5–40)

## 2022-10-10 LAB — CBC WITH DIFFERENTIAL/PLATELET
Basophils Absolute: 0.1 10*3/uL (ref 0.0–0.2)
Basos: 1 %
EOS (ABSOLUTE): 0.3 10*3/uL (ref 0.0–0.4)
Eos: 5 %
Hematocrit: 44.3 % (ref 34.0–46.6)
Hemoglobin: 15 g/dL (ref 11.1–15.9)
Immature Grans (Abs): 0 10*3/uL (ref 0.0–0.1)
Immature Granulocytes: 0 %
Lymphocytes Absolute: 2.1 10*3/uL (ref 0.7–3.1)
Lymphs: 33 %
MCH: 29.7 pg (ref 26.6–33.0)
MCHC: 33.9 g/dL (ref 31.5–35.7)
MCV: 88 fL (ref 79–97)
Monocytes Absolute: 0.5 10*3/uL (ref 0.1–0.9)
Monocytes: 8 %
Neutrophils Absolute: 3.3 10*3/uL (ref 1.4–7.0)
Neutrophils: 53 %
Platelets: 238 10*3/uL (ref 150–450)
RBC: 5.05 x10E6/uL (ref 3.77–5.28)
RDW: 12.3 % (ref 11.7–15.4)
WBC: 6.3 10*3/uL (ref 3.4–10.8)

## 2022-10-10 LAB — COMPREHENSIVE METABOLIC PANEL
ALT: 19 IU/L (ref 0–32)
AST: 19 IU/L (ref 0–40)
Albumin/Globulin Ratio: 1.7 (ref 1.2–2.2)
Albumin: 4.5 g/dL (ref 3.9–4.9)
Alkaline Phosphatase: 75 IU/L (ref 44–121)
BUN/Creatinine Ratio: 29 — ABNORMAL HIGH (ref 9–23)
BUN: 19 mg/dL (ref 6–20)
Bilirubin Total: 0.3 mg/dL (ref 0.0–1.2)
CO2: 19 mmol/L — ABNORMAL LOW (ref 20–29)
Calcium: 9.8 mg/dL (ref 8.7–10.2)
Chloride: 105 mmol/L (ref 96–106)
Creatinine, Ser: 0.65 mg/dL (ref 0.57–1.00)
Globulin, Total: 2.6 g/dL (ref 1.5–4.5)
Glucose: 98 mg/dL (ref 70–99)
Potassium: 4.4 mmol/L (ref 3.5–5.2)
Sodium: 139 mmol/L (ref 134–144)
Total Protein: 7.1 g/dL (ref 6.0–8.5)
eGFR: 115 mL/min/{1.73_m2} (ref >59)

## 2022-10-10 LAB — VITAMIN D 25 HYDROXY (VIT D DEFICIENCY, FRACTURES): Vit D, 25-Hydroxy: 27.2 ng/mL — ABNORMAL LOW (ref 30.0–100.0)

## 2022-10-10 LAB — TSH: TSH: 1.32 u[IU]/mL (ref 0.450–4.500)

## 2022-10-11 ENCOUNTER — Encounter: Payer: Self-pay | Admitting: Nurse Practitioner

## 2022-10-11 NOTE — Progress Notes (Unsigned)
Subjective:  Patient ID: Virginia Brock, female    DOB: 1981-03-07  Age: 41 y.o. MRN: [id number]  Chief Complaint  Patient presents with   Weight Management F/U    HPI   Patient presents today for follow-up of Vitamin D deficiency, weight loss management, left ear otitis media, and to discuss lab results.  Vitamin D deficiency, follow-up  Lab Results  Component Value Date   VD25OH 27.2 (L) 10/09/2022   CALCIUM 9.8 10/09/2022   CALCIUM 9.2 05/21/2022   Wt Readings from Last 3 Encounters:  10/12/22 264 lb (119.7 kg)  09/06/22 270 lb (122.5 kg)  05/21/22 260 lb (117.9 kg)    She was last seen for vitamin D deficiency 4 weeks ago.  Management since that visit includes Vitamin D rich diet. She reports good compliance with treatment. She is not having side effects.     Weight management, follow-up: Pt was prescribed Wegovy 0.25 mg injections to assist with weight loss. She has modified her diet and increased physical activity. Reports side effects of mild nausea. Current weight is 264 lbs, BMI 42.61.       Current Outpatient Medications on File Prior to Visit  Medication Sig Dispense Refill   acetaminophen (TYLENOL) 500 MG tablet Take 1,000 mg by mouth every 6 (six) hours as needed (for pain.).     fluticasone (FLONASE) 50 MCG/ACT nasal spray Place 2 sprays into both nostrils daily. 16 g 6   ibuprofen (ADVIL) 200 MG tablet Take 800 mg by mouth every 8 (eight) hours as needed (for pain.).     levocetirizine (XYZAL) 5 MG tablet Take 1 tablet (5 mg total) by mouth every evening. 90 tablet 2   metFORMIN (GLUCOPHAGE) 500 MG tablet Take 1 tablet (500 mg total) by mouth 2 (two) times daily with a meal. 180 tablet 3   ondansetron (ZOFRAN) 4 MG tablet Take 1 tablet (4 mg total) by mouth every 8 (eight) hours as needed for nausea or vomiting. 20 tablet 0   rosuvastatin (CRESTOR) 5 MG tablet Take 1 tablet (5 mg total) by mouth daily. 90 tablet 0   Semaglutide-Weight Management  (WEGOVY) 0.25 MG/0.5ML SOAJ Inject 0.25 mg into the skin once a week. 2 mL 0   Semaglutide-Weight Management 0.25 MG/0.5ML SOAJ Inject 0.25 mg into the skin once a week for 28 days. 2 mL 0   Semaglutide-Weight Management 0.5 MG/0.5ML SOAJ Inject 0.5 mg into the skin once a week for 28 days. 2 mL 0   [START ON 11/03/2022] Semaglutide-Weight Management 1 MG/0.5ML SOAJ Inject 1 mg into the skin once a week for 28 days. 2 mL 0   [START ON 12/02/2022] Semaglutide-Weight Management 1.7 MG/0.75ML SOAJ Inject 1.7 mg into the skin once a week for 28 days. 3 mL 0   [START ON 12/31/2022] Semaglutide-Weight Management 2.4 MG/0.75ML SOAJ Inject 2.4 mg into the skin once a week for 28 days. 3 mL 0   sertraline (ZOLOFT) 100 MG tablet Take 1 tablet (100 mg total) by mouth daily. 90 tablet 3   spironolactone (ALDACTONE) 50 MG tablet TAKE 1 TABLET BY MOUTH ONCE DAILY 90 tablet 0   triamcinolone cream (KENALOG) 0.1 % Apply 1 application. topically 2 (two) times daily. 30 g 0   valsartan (DIOVAN) 80 MG tablet Take 1 tablet (80 mg total) by mouth daily. 90 tablet 1   No current facility-administered medications on file prior to visit.   Past Medical History:  Diagnosis Date   Depression  Fibroid tumor    Right Breast Lumpectomy   Hypertension    Migraine    PCOS (polycystic ovarian syndrome)    Past Surgical History:  Procedure Laterality Date   BREAST LUMPECTOMY Right    Fibroid Tumor   CESAREAN SECTION      Family History  Problem Relation Age of Onset   Stroke Mother    Parkinson's disease Mother    Atrial fibrillation Mother    Cancer Father        breast, liver, spine and bone   Cancer Other        prostate, colon, liver, lung   Diabetes Other    Autism Child    Social History   Socioeconomic History   Marital status: Married    Spouse name: Sonora Catlin   Number of children: 2   Years of education: Not on file   Highest education level: Not on file  Occupational History   Not on  file  Tobacco Use   Smoking status: Former    Packs/day: 0.50    Years: 4.00    Total pack years: 2.00    Types: Cigarettes    Quit date: 08/15/2013    Years since quitting: 9.1   Smokeless tobacco: Never  Substance and Sexual Activity   Alcohol use: Yes   Drug use: Not Currently    Types: Marijuana    Comment: Used as a teenager   Sexual activity: Yes    Partners: Male    Birth control/protection: I.U.D.  Other Topics Concern   Not on file  Social History Narrative   ** Merged History Encounter **       Social Determinants of Health   Financial Resource Strain: Low Risk  (05/21/2022)   Overall Financial Resource Strain (CARDIA)    Difficulty of Paying Living Expenses: Not hard at all  Food Insecurity: No Food Insecurity (05/21/2022)   Hunger Vital Sign    Worried About Running Out of Food in the Last Year: Never true    Ran Out of Food in the Last Year: Never true  Transportation Needs: No Transportation Needs (05/21/2022)   PRAPARE - Hydrologist (Medical): No    Lack of Transportation (Non-Medical): No  Physical Activity: Inactive (05/21/2022)   Exercise Vital Sign    Days of Exercise per Week: 0 days    Minutes of Exercise per Session: 0 min  Stress: No Stress Concern Present (05/21/2022)   [place]    Feeling of Stress : Not at all  Social Connections: Moderately Isolated (05/21/2022)   Social Connection and Isolation Panel [NHANES]    Frequency of Communication with Friends and Family: More than three times a week    Frequency of Social Gatherings with Friends and Family: More than three times a week    Attends Religious Services: Never    Marine scientist or Organizations: No    Attends Archivist Meetings: Never    Marital Status: Married    Review of Systems  Constitutional:  Negative for chills, fatigue and fever.  HENT:  Negative for congestion,  ear pain, rhinorrhea and sore throat.   Respiratory:  Negative for cough and shortness of breath.   Cardiovascular:  Negative for chest pain.  Gastrointestinal:  Negative for abdominal pain, constipation, diarrhea, nausea and vomiting.  Genitourinary:  Negative for dysuria and urgency.  Musculoskeletal:  Negative for back pain and myalgias.  Neurological:  Negative for dizziness, weakness, light-headedness and headaches.  Psychiatric/Behavioral:  Negative for dysphoric mood. The patient is not nervous/anxious.      Objective:  There were no vitals taken for this visit.     09/06/2022    3:44 PM 05/21/2022   11:02 AM 01/23/2022    2:30 PM  BP/Weight  Systolic BP [phone]  Diastolic BP 64 72 80  Wt. (Lbs) 270 260 251  BMI 43.58 kg/m2 41.03 kg/m2 40.51 kg/m2    Physical Exam Vitals reviewed.  Constitutional:      Appearance: She is obese.  HENT:     Right Ear: Tympanic membrane is injected and erythematous.  Neurological:     Mental Status: She is alert.   Diabetic Foot Exam - Simple   No data filed      Lab Results  Component Value Date   WBC 6.3 10/09/2022   HGB 15.0 10/09/2022   HCT 44.3 10/09/2022   PLT 238 10/09/2022   GLUCOSE 98 10/09/2022   CHOL 137 10/09/2022   TRIG 138 10/09/2022   HDL 42 10/09/2022   LDLCALC 71 10/09/2022   ALT 19 10/09/2022   AST 19 10/09/2022   NA 139 10/09/2022   K 4.4 10/09/2022   CL 105 10/09/2022   CREATININE 0.65 10/09/2022   BUN 19 10/09/2022   CO2 19 (L) 10/09/2022   TSH 1.320 10/09/2022   HGBA1C 5.5 06/18/2022      Assessment & Plan:   .diag      Use Ciprodex drops to left ear twice daily for 3 days Continue Semaglutide for weight loss Begin Vit D 50, 000 U weekly Vit D rich diet Follow-up in 8 weeks   Follow-up: 8-weeks  An After Visit Summary was printed and given to the patient.  I, Rip Harbour, NP, have reviewed all documentation for this visit. The documentation on 10/12/22 for the exam,  diagnosis, procedures, and orders are all accurate and complete.    Signed, Rip Harbour, NP [place] [phone]

## 2022-10-12 ENCOUNTER — Ambulatory Visit: Payer: BC Managed Care – PPO | Admitting: Nurse Practitioner

## 2022-10-12 ENCOUNTER — Encounter: Payer: Self-pay | Admitting: Nurse Practitioner

## 2022-10-12 VITALS — BP 130/68 | HR 85 | Temp 97.0°F | Ht 66.0 in | Wt 264.0 lb

## 2022-10-12 DIAGNOSIS — Z7689 Persons encountering health services in other specified circumstances: Secondary | ICD-10-CM | POA: Diagnosis not present

## 2022-10-12 DIAGNOSIS — H65115 Acute and subacute allergic otitis media (mucoid) (sanguinous) (serous), recurrent, left ear: Secondary | ICD-10-CM | POA: Diagnosis not present

## 2022-10-12 DIAGNOSIS — E559 Vitamin D deficiency, unspecified: Secondary | ICD-10-CM | POA: Diagnosis not present

## 2022-10-12 DIAGNOSIS — Z6841 Body Mass Index (BMI) 40.0 and over, adult: Secondary | ICD-10-CM

## 2022-10-12 MED ORDER — VITAMIN D (ERGOCALCIFEROL) 1.25 MG (50000 UNIT) PO CAPS: 50000.0000 [IU] | ORAL_CAPSULE | ORAL | 3 refills | Status: DC

## 2022-10-12 MED ORDER — CIPROFLOXACIN-DEXAMETHASONE 0.3-0.1 % OT SUSP: 4.0000 [drp] | Freq: Two times a day (BID) | OTIC | 0 refills | Status: DC

## 2022-10-12 MED ORDER — SEMAGLUTIDE(0.25 OR 0.5MG/DOS) 2 MG/3ML ~~LOC~~ SOPN: 0.5000 mg | PEN_INJECTOR | SUBCUTANEOUS | 0 refills | Status: DC

## 2022-10-12 NOTE — Patient Instructions (Signed)
Use Ciprodex drops to left ear twice daily for 3 days Continue Semaglutide for weight loss Begin Vit D 50, 000 U weekly Vit D rich diet Follow-up in 8 weeks  Preventing Vitamin D Deficiency Vitamin D deficiency is when your body does not have enough vitamin D. Vitamin D is important because it helps your body maintain calcium and phosphorus levels. It plays a key role in the health of bones and teeth, reduces inflammation, and improves the body's defense system (immune system). Our bodies make vitamin D when our skin is exposed to direct sunlight. However, for many people, this may not be enough vitamin D to meet the body's needs. How can this condition affect me? If vitamin D deficiency is severe, it can cause a condition in which a person's bones become softer than normal. In adults, this condition is called osteomalacia. In children, this condition is called rickets. Vitamin D deficiency can also cause weak or thin bones (osteoporosis) in adults. What can increase my risk? You may be at risk for a vitamin D deficiency if you: Are pregnant. Are obese. Are an older adult. Have dark skin. Take certain medicines that affect the way vitamin D is absorbed. Have had a surgery in which a part of the stomach or a part of the small intestine was removed. Other risk factors include: Having a condition that limits your ability to absorb fat, such as Crohn's disease, long-term (chronic) pancreatitis, or cystic fibrosis. Having certain conditions that are passed from parent to child (inherited). Not having access to foods rich in vitamin D. Having limited ability to move and go outside safely. Living in areas that have fewer hours of sunlight. Spending most of your day indoors, or covering your skin all the time when you are outdoors. What actions can I take to reduce my risk of a vitamin D deficiency? Knowing the best sources of vitamin D You can meet your daily vitamin D needs  from: Foods. Dietary supplements. Direct exposure to natural sunlight. Infant formula, for infants. Knowing how much vitamin D you need General recommendations for daily vitamin D intake vary by these categories: Infants: 400 international units (IU). Children older than 1 year: 600 international units. Adults: 600 international units. Pregnant and breastfeeding women: 600 international units. Adults older than 70 years: 800 international units. These are minimum levels of recommended amounts. Your health care provider may recommend a different amount of vitamin D intake based on your specific needs and your overall health. Getting sun exposure Get regular, safe exposure to natural sunlight. Expose your skin to direct sunlight for at least 15 minutes every day. If you have dark skin, you may need to expose your skin for a longer period of time. Protect your skin from too much sun exposure. This helps to prevent skin cancer. Ask your health care provider if regular sun exposure is safe for you. Do not use a tanning bed. Eating and drinking  Eat foods that naturally contain vitamin D. These include: Beef liver. Eggs. The vitamin D is in the yolk. Fish, such as salmon or trout. Mushrooms that were treated with UV light. Eat or drink products that have vitamin D added to them (are fortified). These may include: Cereals. Milk, including plant-based alternatives such as almond, soy, or oat milks. Orange juice. Margarine. When choosing foods, check the food label on the package to see: How much vitamin D is in the item. If the food is fortified with vitamin D. The items listed above  may not be a complete list of foods and beverages you can eat and drink. Contact a dietitian for more information. Taking supplements and medicines If you are at risk for vitamin D deficiency, or if you have certain diseases, your health care provider may recommend that you take a vitamin D supplement. Make  sure you: Talk with your health care provider before you start taking any vitamin D supplements. You may be more sensitive to the side effects of vitamin D supplements if you are on certain medicines or have certain medical conditions. Tell your health care provider about all medicines you are taking, including vitamins, herbs, eye drops, creams, and over-the-counter medicines. Take over-the-counter and prescription medicines only as told by your health care provider. Take supplements only as told by your health care provider. To increase absorption of your supplement, take it with a meal or snack. Summary Vitamin D plays a key role in the health of bones and teeth, reduces inflammation, and improves the body's defense system (immune system). A vitamin D deficiency can put you at risk of developing conditions such as rickets or osteoporosis. Our bodies make vitamin D when our skin is exposed to direct sunlight. However, for many people, this may not be enough vitamin D to meet the body's needs. Some foods naturally contain vitamin D, including beef liver, egg yolk, and fish. Eat or drink products that have vitamin D added to them (are fortified). This information is not intended to replace advice given to you by your health care provider. Make sure you discuss any questions you have with your health care provider. Document Revised: 08/25/2021 Document Reviewed: 08/25/2021 Elsevier Patient Education  [place].

## 2022-11-06 ENCOUNTER — Other Ambulatory Visit: Payer: Self-pay | Admitting: Nurse Practitioner

## 2022-11-06 DIAGNOSIS — J309 Allergic rhinitis, unspecified: Secondary | ICD-10-CM

## 2022-11-09 ENCOUNTER — Other Ambulatory Visit: Payer: Self-pay | Admitting: Nurse Practitioner

## 2022-11-09 DIAGNOSIS — E88819 Insulin resistance, unspecified: Secondary | ICD-10-CM

## 2022-11-09 DIAGNOSIS — E282 Polycystic ovarian syndrome: Secondary | ICD-10-CM

## 2022-11-09 DIAGNOSIS — L680 Hirsutism: Secondary | ICD-10-CM

## 2022-12-05 ENCOUNTER — Encounter: Payer: Self-pay | Admitting: Nurse Practitioner

## 2022-12-10 ENCOUNTER — Telehealth: Payer: Self-pay

## 2022-12-10 ENCOUNTER — Encounter: Payer: Self-pay | Admitting: Nurse Practitioner

## 2022-12-10 NOTE — Telephone Encounter (Signed)
Patient left voicemail stating she unable to find wegovy at the 1.'7mg'$  dose, questions what she needs to do, has been taking the 0.5 mg dose for the past 2 months.

## 2022-12-12 ENCOUNTER — Other Ambulatory Visit: Payer: Self-pay | Admitting: Nurse Practitioner

## 2022-12-12 MED ORDER — ZEPBOUND 2.5 MG/0.5ML ~~LOC~~ SOAJ: 2.5000 mg | SUBCUTANEOUS | 0 refills | Status: DC

## 2022-12-17 ENCOUNTER — Other Ambulatory Visit: Payer: Self-pay | Admitting: Nurse Practitioner

## 2022-12-17 MED ORDER — WEGOVY 1.7 MG/0.75ML ~~LOC~~ SOAJ: 1.7000 mg | SUBCUTANEOUS | 0 refills | Status: DC

## 2022-12-18 ENCOUNTER — Other Ambulatory Visit: Payer: Self-pay | Admitting: Nurse Practitioner

## 2022-12-18 DIAGNOSIS — L409 Psoriasis, unspecified: Secondary | ICD-10-CM

## 2022-12-18 DIAGNOSIS — R110 Nausea: Secondary | ICD-10-CM

## 2022-12-19 ENCOUNTER — Telehealth: Payer: Self-pay

## 2022-12-19 MED ORDER — TRIAMCINOLONE ACETONIDE 0.1 % EX CREA: 1.0000 | TOPICAL_CREAM | Freq: Two times a day (BID) | CUTANEOUS | 0 refills | Status: DC

## 2022-12-19 MED ORDER — ONDANSETRON HCL 4 MG PO TABS: 4.0000 mg | ORAL_TABLET | Freq: Three times a day (TID) | ORAL | 0 refills | Status: DC | PRN

## 2022-12-19 NOTE — Telephone Encounter (Signed)
PA submitted and approved for zepbound via covermymeds on 12/13/2022.

## 2022-12-26 ENCOUNTER — Other Ambulatory Visit: Payer: Self-pay | Admitting: Nurse Practitioner

## 2023-01-14 ENCOUNTER — Other Ambulatory Visit: Payer: Self-pay

## 2023-01-14 DIAGNOSIS — R030 Elevated blood-pressure reading, without diagnosis of hypertension: Secondary | ICD-10-CM

## 2023-01-14 DIAGNOSIS — I10 Essential (primary) hypertension: Secondary | ICD-10-CM

## 2023-01-14 MED ORDER — VALSARTAN 80 MG PO TABS: 80.0000 mg | ORAL_TABLET | Freq: Every day | ORAL | 0 refills | Status: DC

## 2023-01-22 ENCOUNTER — Other Ambulatory Visit: Payer: Self-pay

## 2023-01-22 DIAGNOSIS — E559 Vitamin D deficiency, unspecified: Secondary | ICD-10-CM

## 2023-01-22 MED ORDER — VITAMIN D (ERGOCALCIFEROL) 1.25 MG (50000 UNIT) PO CAPS: 50000.0000 [IU] | ORAL_CAPSULE | ORAL | 3 refills | Status: DC

## 2023-02-06 ENCOUNTER — Other Ambulatory Visit: Payer: Self-pay

## 2023-02-07 ENCOUNTER — Other Ambulatory Visit: Payer: Self-pay

## 2023-02-07 DIAGNOSIS — E88819 Insulin resistance, unspecified: Secondary | ICD-10-CM

## 2023-02-07 DIAGNOSIS — E282 Polycystic ovarian syndrome: Secondary | ICD-10-CM

## 2023-02-07 DIAGNOSIS — L680 Hirsutism: Secondary | ICD-10-CM

## 2023-02-07 MED ORDER — SPIRONOLACTONE 50 MG PO TABS: 50.0000 mg | ORAL_TABLET | Freq: Every day | ORAL | 0 refills | Status: DC

## 2023-02-18 ENCOUNTER — Other Ambulatory Visit: Payer: Self-pay

## 2023-02-18 DIAGNOSIS — E782 Mixed hyperlipidemia: Secondary | ICD-10-CM

## 2023-02-18 DIAGNOSIS — F331 Major depressive disorder, recurrent, moderate: Secondary | ICD-10-CM

## 2023-02-18 MED ORDER — ROSUVASTATIN CALCIUM 5 MG PO TABS: 5.0000 mg | ORAL_TABLET | Freq: Every day | ORAL | 0 refills | Status: DC

## 2023-02-18 MED ORDER — SERTRALINE HCL 100 MG PO TABS: 100.0000 mg | ORAL_TABLET | Freq: Every day | ORAL | 0 refills | Status: DC

## 2023-05-16 ENCOUNTER — Other Ambulatory Visit: Payer: Self-pay | Admitting: Family Medicine

## 2023-05-16 DIAGNOSIS — F331 Major depressive disorder, recurrent, moderate: Secondary | ICD-10-CM

## 2023-06-17 ENCOUNTER — Other Ambulatory Visit: Payer: Self-pay | Admitting: Family Medicine

## 2023-06-17 DIAGNOSIS — E88819 Insulin resistance, unspecified: Secondary | ICD-10-CM

## 2023-06-17 DIAGNOSIS — L680 Hirsutism: Secondary | ICD-10-CM

## 2023-06-17 DIAGNOSIS — I10 Essential (primary) hypertension: Secondary | ICD-10-CM

## 2023-06-17 DIAGNOSIS — R030 Elevated blood-pressure reading, without diagnosis of hypertension: Secondary | ICD-10-CM

## 2023-06-17 DIAGNOSIS — E282 Polycystic ovarian syndrome: Secondary | ICD-10-CM

## 2023-06-17 DIAGNOSIS — E782 Mixed hyperlipidemia: Secondary | ICD-10-CM

## 2023-06-18 MED ORDER — VALSARTAN 80 MG PO TABS: 80.0000 mg | ORAL_TABLET | Freq: Every day | ORAL | 0 refills | Status: DC

## 2023-06-18 MED ORDER — ROSUVASTATIN CALCIUM 5 MG PO TABS: 5.0000 mg | ORAL_TABLET | Freq: Every day | ORAL | 0 refills | Status: DC

## 2023-06-18 MED ORDER — SPIRONOLACTONE 50 MG PO TABS: 50.0000 mg | ORAL_TABLET | Freq: Every day | ORAL | 0 refills | Status: DC

## 2023-06-18 NOTE — Telephone Encounter (Signed)
Patient needs appt for additional refills. 06/18/23 kb

## 2023-07-14 ENCOUNTER — Other Ambulatory Visit: Payer: Self-pay | Admitting: Family Medicine

## 2023-07-14 DIAGNOSIS — E559 Vitamin D deficiency, unspecified: Secondary | ICD-10-CM

## 2023-08-25 ENCOUNTER — Other Ambulatory Visit: Payer: Self-pay | Admitting: Family Medicine

## 2023-08-25 DIAGNOSIS — F331 Major depressive disorder, recurrent, moderate: Secondary | ICD-10-CM

## 2023-08-27 ENCOUNTER — Other Ambulatory Visit: Payer: Self-pay

## 2023-08-27 DIAGNOSIS — J309 Allergic rhinitis, unspecified: Secondary | ICD-10-CM

## 2023-09-17 IMAGING — US US THYROID
1 series · 13 of 25 positions shown · non-contrast
Comparison: None.

CLINICAL DATA: Right thyroid nodule

EXAM:
THYROID ULTRASOUND
TECHNIQUE: Ultrasound examination of the thyroid gland and adjacent soft
tissues was performed.

[Series 1: us thyroid · 13 of 41 slices shown]
[im 1/41]
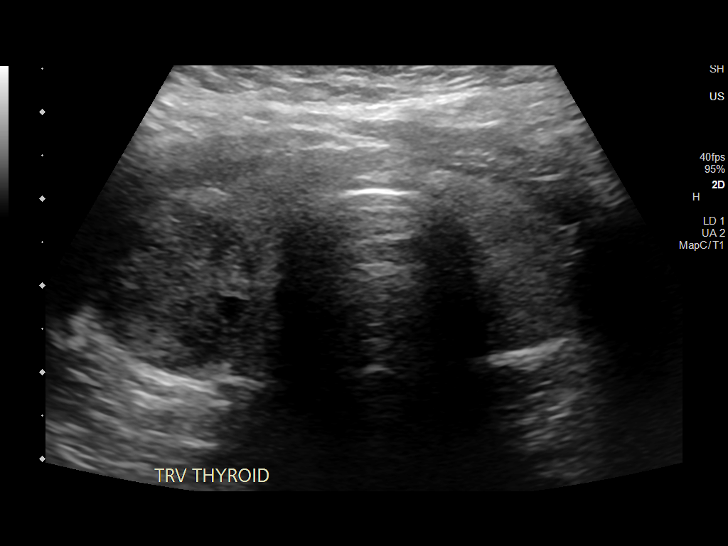
[im 4/41]
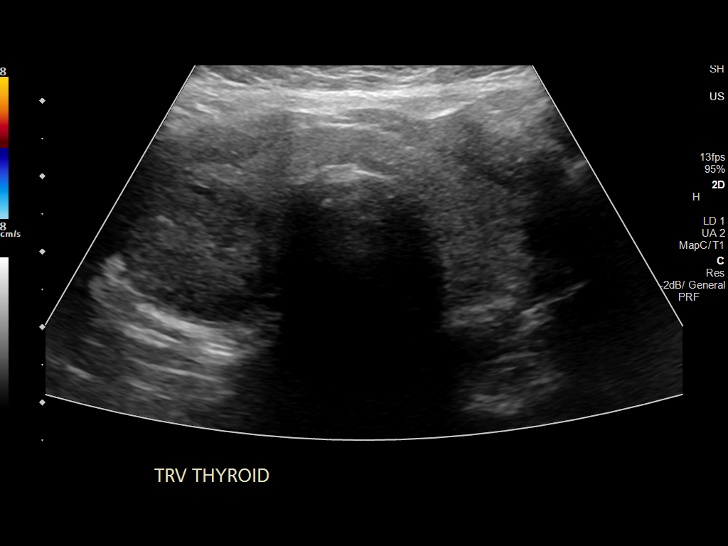
[im 7/41]
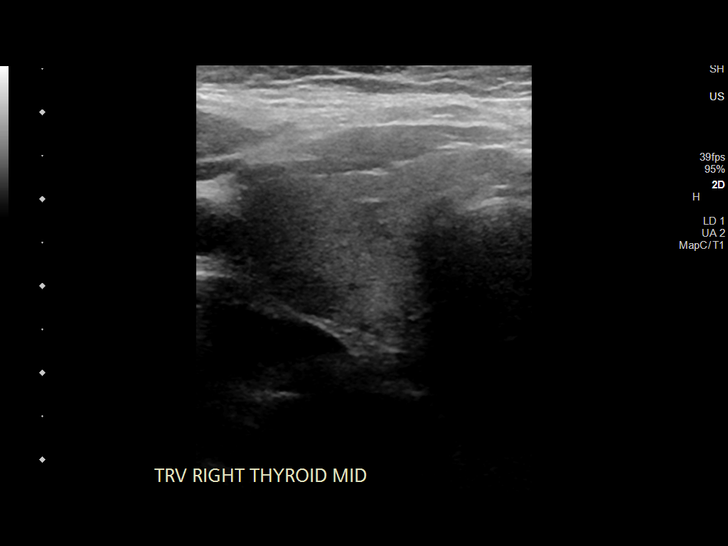
[im 11/41]
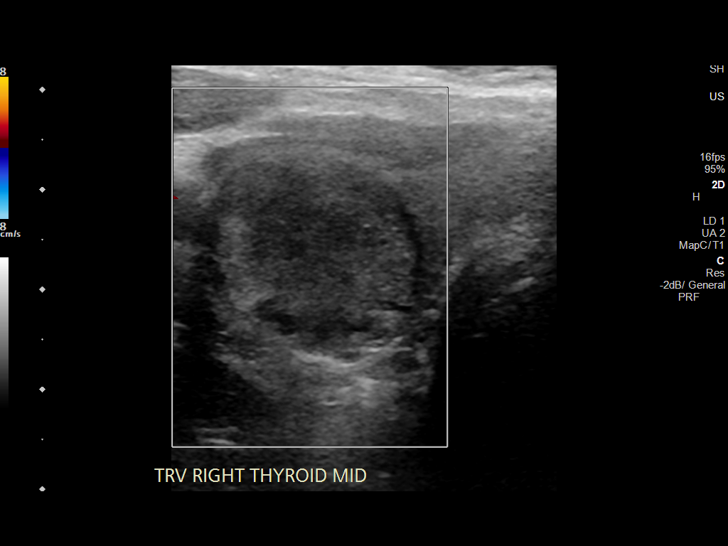
[im 14/41]
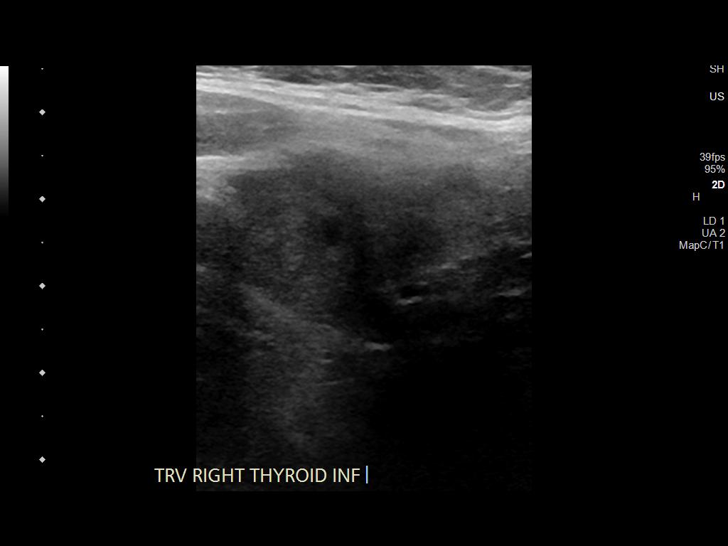
[im 17/41]
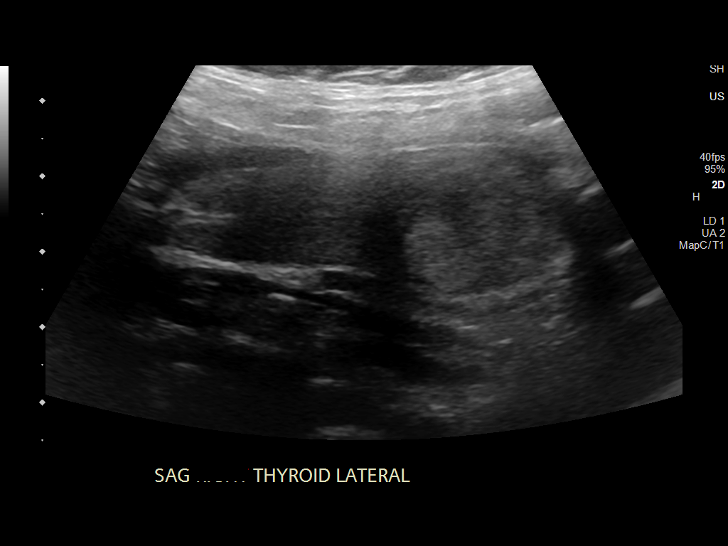
[im 21/41]
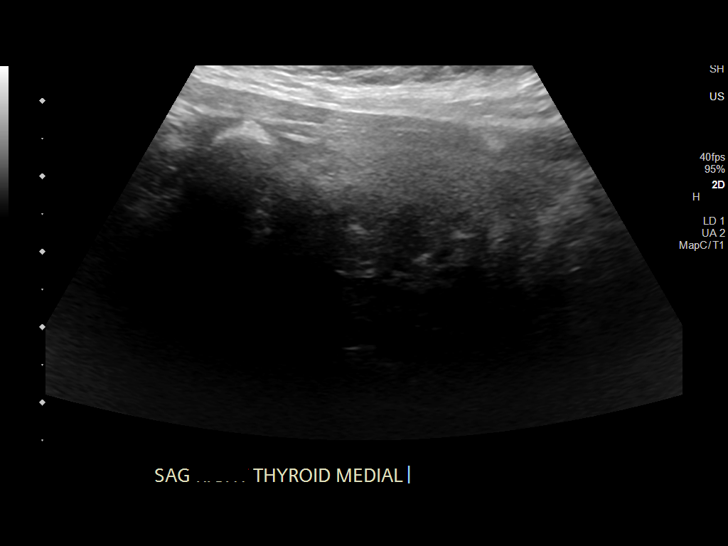
[im 24/41]
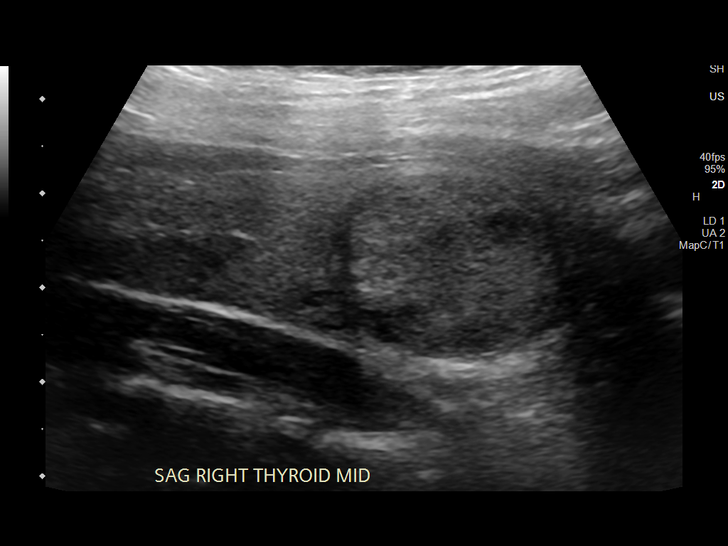
[im 27/41]
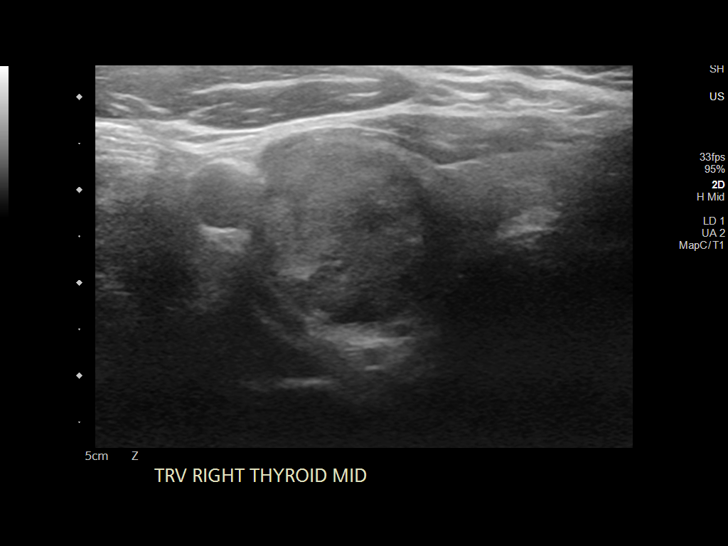
[im 31/41]
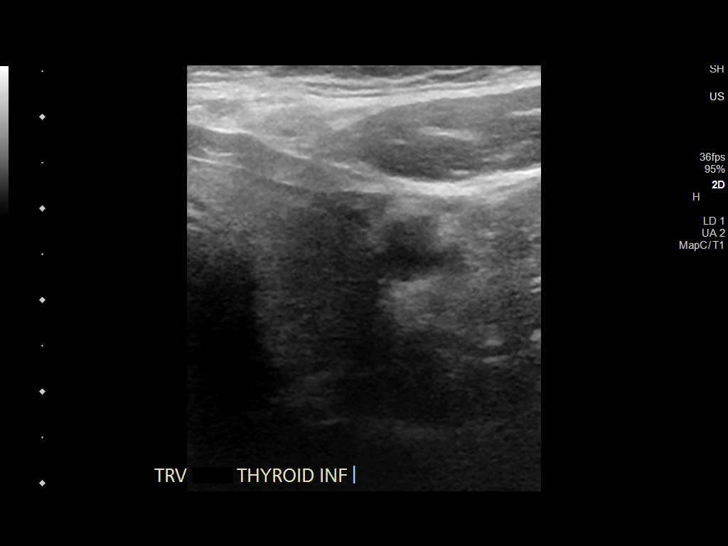
[im 34/41]
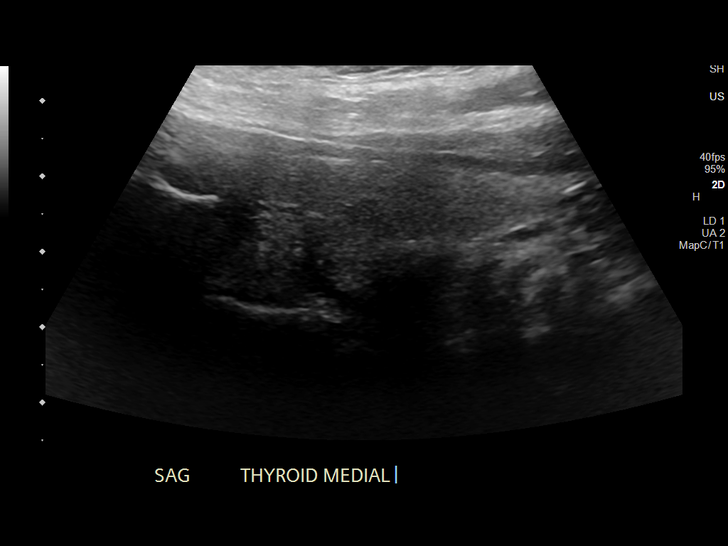
[im 37/41]
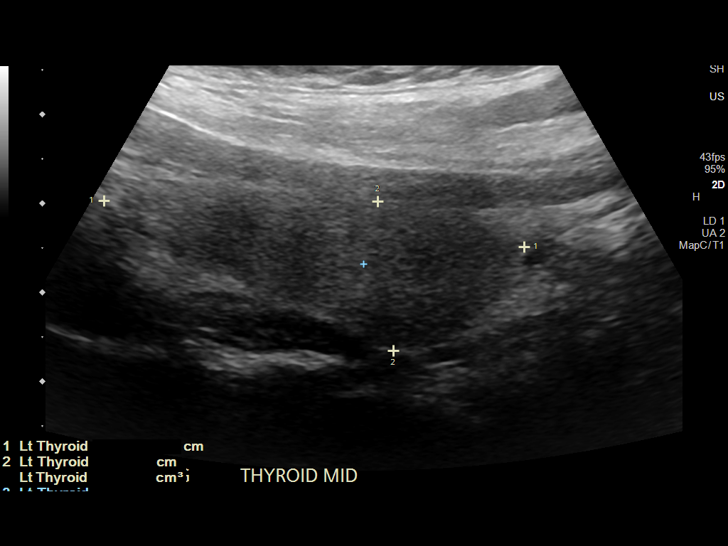
[im 41/41]
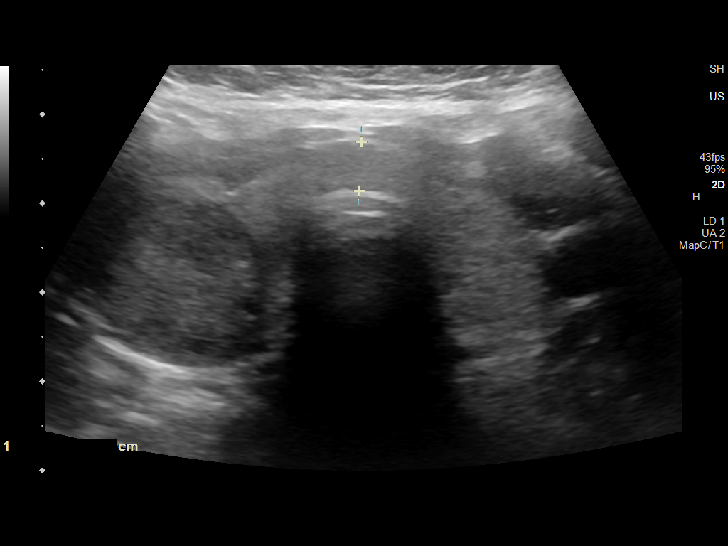

[13 of 25 positions shown; findings below may reference images not displayed]

FINDINGS: Parenchymal Echotexture: Mildly heterogenous

Isthmus: 0.6 cm

Right lobe: 6.1 x 2.8 x 2.6 cm

Left lobe: 4.8 x 1.7 x 1.5 cm

_________________________________________________________

Estimated total number of nodules >/= 1 cm: 1

Number of spongiform nodules >/=  2 cm not described below (TR1): 0

Number of mixed cystic and solid nodules >/= 1.5 cm not described
below (TR2): 0

_________________________________________________________

Nodule # 1:

Location: Right; mid

Maximum size: 2.4 cm; Other 2 dimensions: 2.2 x 2.0 cm

Composition: solid/almost completely solid (2)

Echogenicity: hypoechoic (2)

Shape: not taller-than-wide (0)

Margins: ill-defined (0)

Echogenic foci: none (0)

ACR TI-RADS total points: 4.

ACR TI-RADS risk category: TR4 (4-6 points).

ACR TI-RADS recommendations:

**Given size (>/= 1.5 cm) and appearance, fine needle aspiration of
this moderately suspicious nodule should be considered based on
TI-RADS criteria.
IMPRESSION: Nodule 1 (TI-RADS 4), measuring 2.4 cm, located in the mid right
thyroid lobe, meets criteria for FNA.

The above is in keeping with the ACR TI-RADS recommendations - [HOSPITAL] 3008;[DATE].

## 2023-10-06 IMAGING — US US FNA BIOPSY THYROID 1ST LESION
1 series · 13 of 18 positions shown · non-contrast
Comparison: US Thyroid 01/17/22

MEDICATIONS:
10 cc 1% lidocaine

COMPLICATIONS:
None immediate.

INDICATION: Right mid lobe thyroid nodule

2.4 cm
EXAM:
ULTRASOUND GUIDED FINE NEEDLE ASPIRATION OF INDETERMINATE THYROID
NODULE
TECHNIQUE: Informed written consent was obtained from the patient after a
discussion of the risks, benefits and alternatives to treatment.
Questions regarding the procedure were encouraged and answered. A
timeout was performed prior to the initiation of the procedure.

[Series 1: us fna biopsy thyroid 1st lesion · 0.07mm/px · 18 acquisitions, 13 frames shown]
[im 1/18]
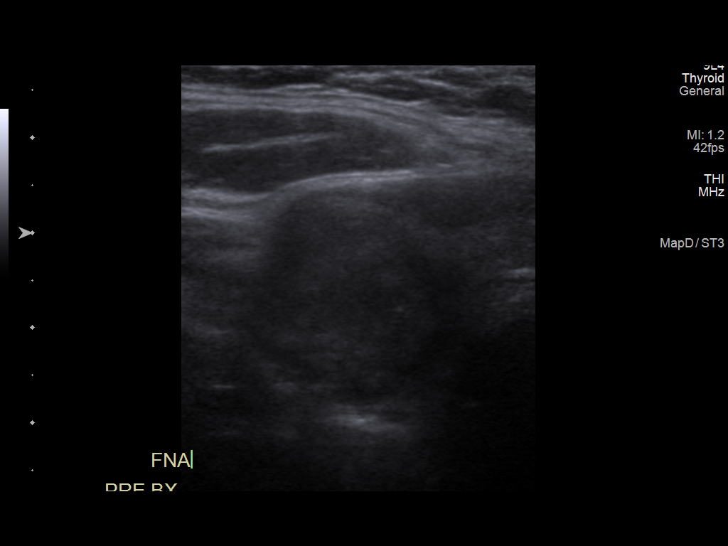
[im 3/18]
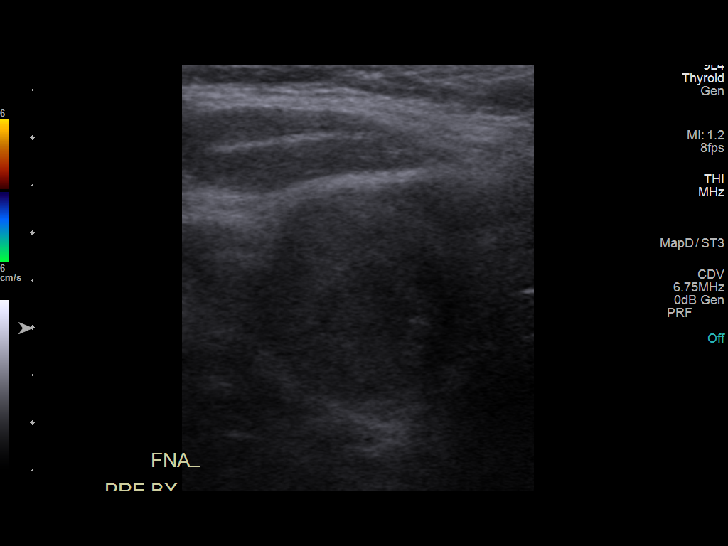
[im 4/18]
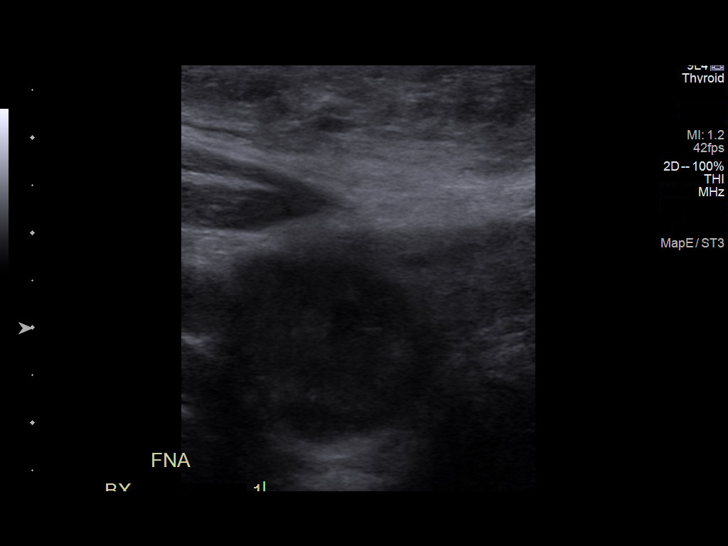
[im 5/18]
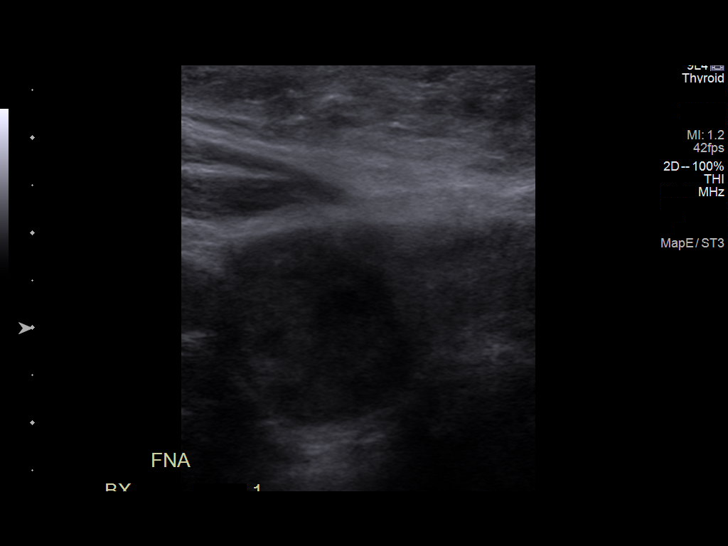
[im 7/18]
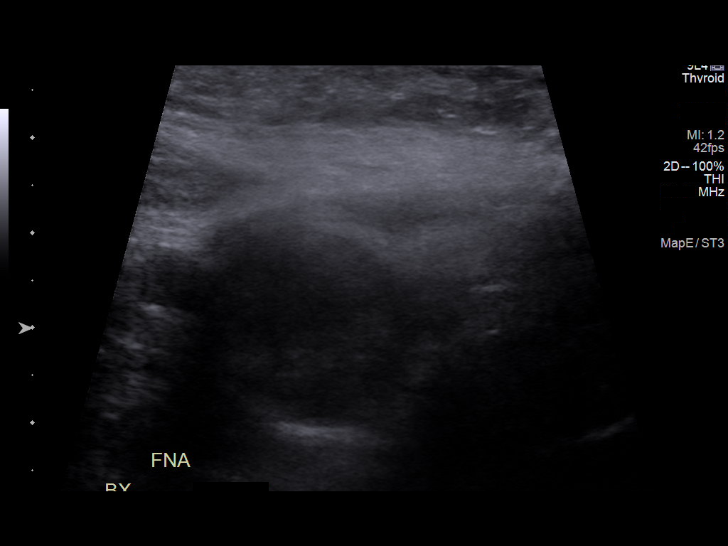
[im 8/18]
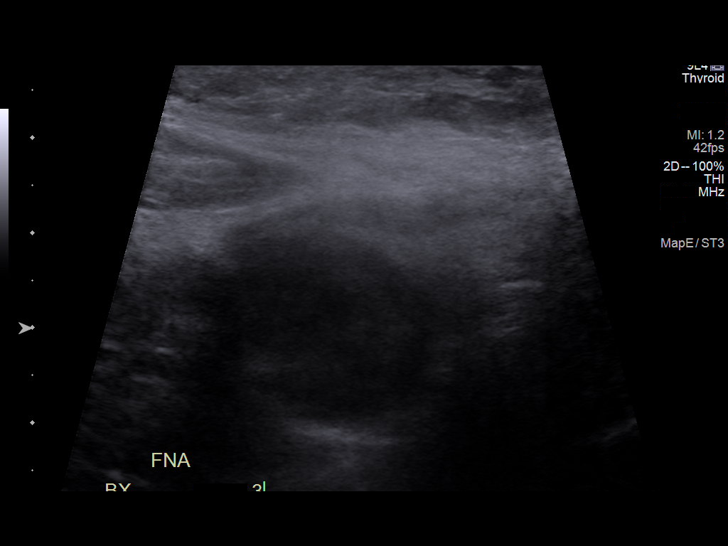
[im 10/18]
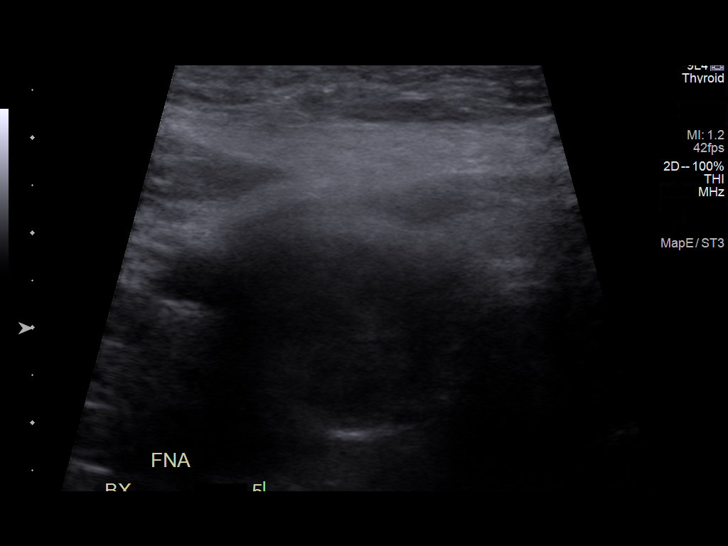
[im 11/18]
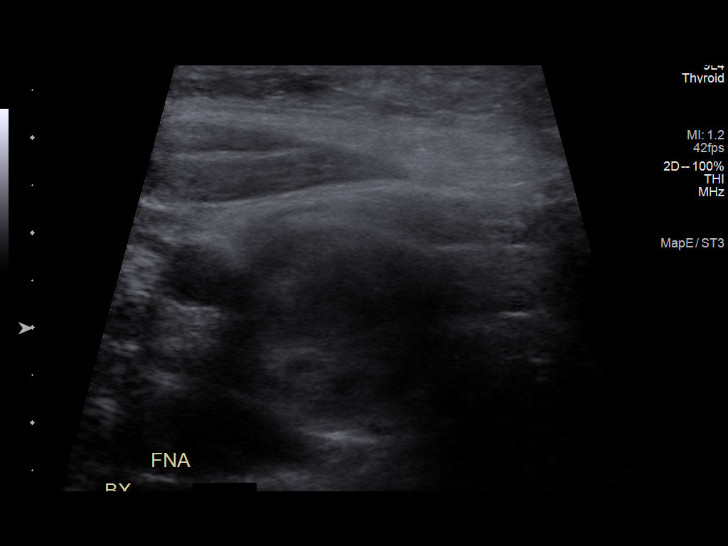
[im 12/18]
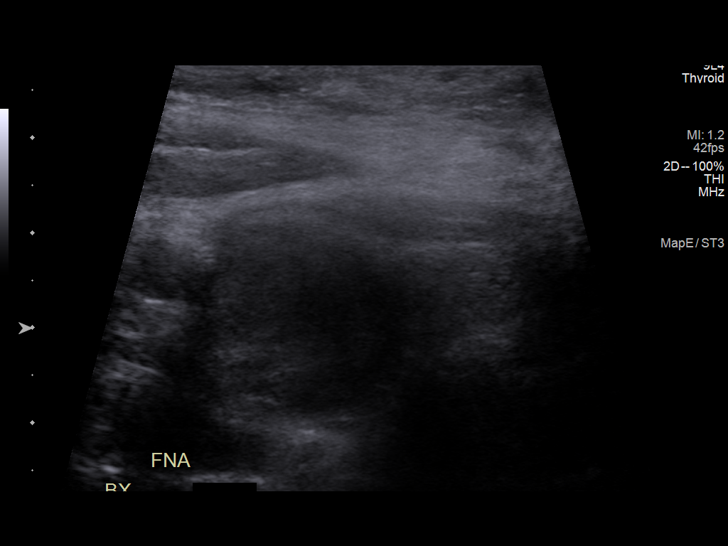
[im 14/18]
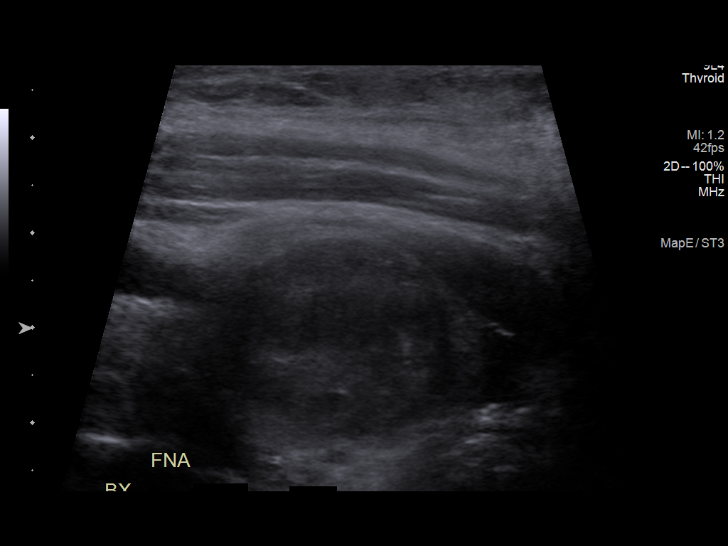
[im 15/18]
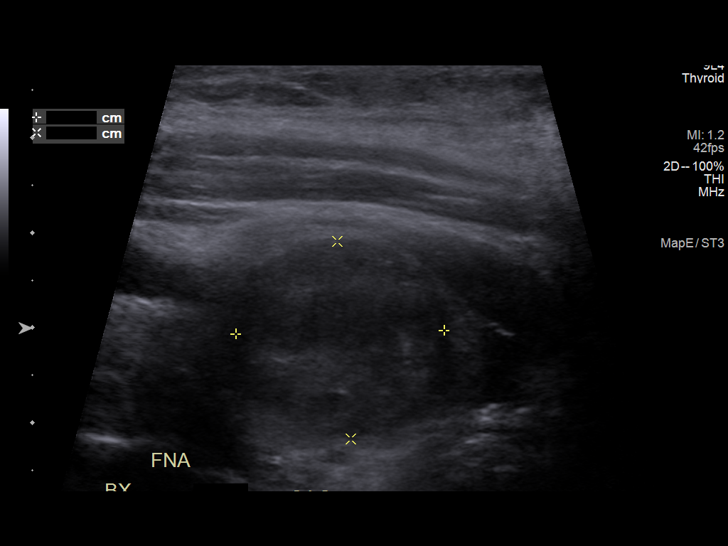
[im 16/18]
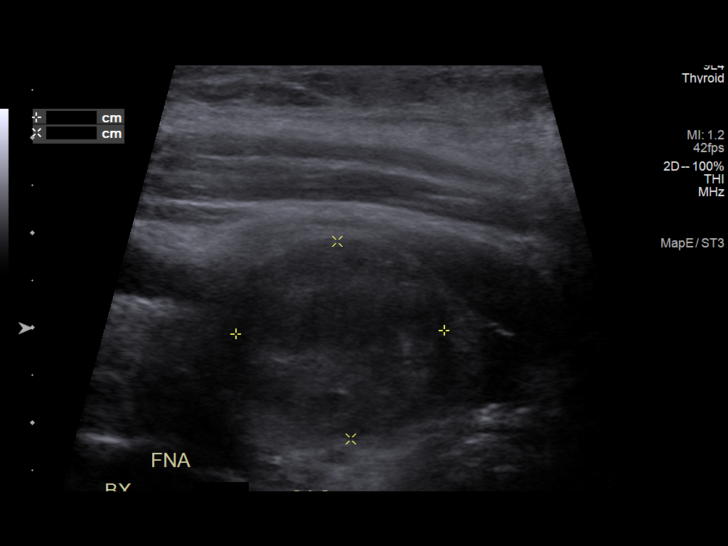
[im 18/18]
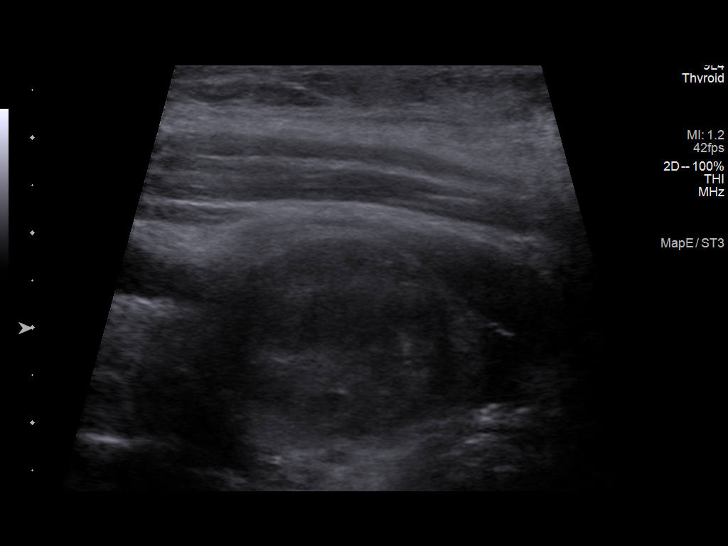

[13 of 18 positions shown; findings below may reference images not displayed]

Pre-procedural ultrasound scanning demonstrated unchanged size and
appearance of the indeterminate nodule within the right thyroid

The procedure was planned. The neck was prepped in the usual sterile
fashion, and a sterile drape was applied covering the operative
field. A timeout was performed prior to the initiation of the
procedure. Local anesthesia was provided with 1% lidocaine.

Under direct ultrasound guidance, 5 FNA biopsies were performed of
the right mid lobe thyroid nodule with a 25 gauge needle.

2 of these samples were sent for AFIRMA

Multiple ultrasound images were saved for procedural documentation
purposes. The samples were prepared and submitted to pathology.

Limited post procedural scanning was negative for hematoma or
additional complication. Dressings were placed. The patient
tolerated the above procedures procedure well without immediate
postprocedural complication.
FINDINGS: Nodule reference number based on prior diagnostic ultrasound: 1

Maximum size: 2.4 cm

Location: Right; Mid

ACR TI-RADS risk category: TR4 (4-6 points)

Reason for biopsy: meets ACR TI-RADS criteria

Ultrasound imaging confirms appropriate placement of the needles
within the thyroid nodule.
IMPRESSION: Technically successful ultrasound guided fine needle aspiration of
right mid lobe thyroid nodule

Read by

Nomasibulele Moatshe

## 2023-10-17 ENCOUNTER — Encounter: Payer: Self-pay | Admitting: Physician Assistant

## 2023-10-17 ENCOUNTER — Ambulatory Visit: Payer: BC Managed Care – PPO | Admitting: Physician Assistant

## 2023-10-17 VITALS — BP 118/80 | HR 80 | Temp 97.6°F | Resp 14 | Ht 66.0 in | Wt 275.0 lb

## 2023-10-17 DIAGNOSIS — E66813 Obesity, class 3: Secondary | ICD-10-CM

## 2023-10-17 DIAGNOSIS — Z6841 Body Mass Index (BMI) 40.0 and over, adult: Secondary | ICD-10-CM

## 2023-10-17 DIAGNOSIS — E282 Polycystic ovarian syndrome: Secondary | ICD-10-CM | POA: Diagnosis not present

## 2023-10-17 DIAGNOSIS — E782 Mixed hyperlipidemia: Secondary | ICD-10-CM | POA: Diagnosis not present

## 2023-10-17 DIAGNOSIS — I10 Essential (primary) hypertension: Secondary | ICD-10-CM

## 2023-10-17 DIAGNOSIS — J309 Allergic rhinitis, unspecified: Secondary | ICD-10-CM

## 2023-10-17 DIAGNOSIS — E559 Vitamin D deficiency, unspecified: Secondary | ICD-10-CM

## 2023-10-17 DIAGNOSIS — E661 Drug-induced obesity: Secondary | ICD-10-CM

## 2023-10-17 DIAGNOSIS — L218 Other seborrheic dermatitis: Secondary | ICD-10-CM

## 2023-10-17 DIAGNOSIS — Z1231 Encounter for screening mammogram for malignant neoplasm of breast: Secondary | ICD-10-CM

## 2023-10-17 MED ORDER — ZORYVE 0.3 % EX FOAM: 1.0000 | Freq: Every day | CUTANEOUS | 2 refills | Status: DC

## 2023-10-17 MED ORDER — VANIQA 13.9 % EX CREA: 1.0000 | TOPICAL_CREAM | Freq: Two times a day (BID) | CUTANEOUS | 2 refills | Status: DC

## 2023-10-17 MED ORDER — FEXOFENADINE-PSEUDOEPHED ER 180-240 MG PO TB24: 1.0000 | ORAL_TABLET | Freq: Every day | ORAL | 1 refills | Status: DC

## 2023-10-17 NOTE — Assessment & Plan Note (Signed)
Self-discontinued Spironolactone due to perceived lack of efficacy in managing hair thinning. No other major symptoms reported. -Consider alternative treatments for hair thinning associated with PCOS after further research.

## 2023-10-17 NOTE — Assessment & Plan Note (Signed)
History of using various topical treatments with limited success including corticosteroids, antifungals. -Initiate Zoryve for scalp psoriasis. Will reassess in 6 months

## 2023-10-17 NOTE — Assessment & Plan Note (Signed)
Self-discontinued Diovan due to symptomatic hypotension. Current blood pressure controlled. -No further action required at this time.

## 2023-10-17 NOTE — Assessment & Plan Note (Signed)
Self-discontinued Xyzal due to perceived lack of efficacy. History of using Zyrtec and Claritin with moderate success. -Trial of Allegra D to be initiated.

## 2023-10-17 NOTE — Progress Notes (Signed)
Subjective:  Patient ID: Virginia Brock, female    DOB: 1981/05/16  Age: 42 y.o. MRN: [id number]  Chief Complaint  Patient presents with   Medical Management of Chronic Issues    HPI     Virginia Brock presents for chronic follow-up of HTN, hyperlipidemia, and depression.  She has history of PCOS.   Discussed the use of AI scribe software for clinical note transcription with the patient, who gave verbal consent to proceed.  History of Present Illness   Virginia Brock, a 42 year old female with a history of hypertension, allergies, psoriasis, polycystic ovarian syndrome (PCOS), and high cholesterol, presents for a chronic follow-up. She reports discontinuing most of her medications due to side effects and perceived lack of efficacy. She stopped taking Diovan due to episodes of dizziness and lightheadedness upon standing, which she attributed to low blood pressure. She has been monitoring her blood pressure at home and reports no major issues. She also discontinued her allergy medication, Xyzal, as she felt it was not helping. She has been off this medication for about a month and requests a new prescription for future use.  Virginia Brock also has psoriasis on her scalp and PCOS. She was on spironolactone for these conditions but ran out of refills. She reports no significant improvement in hair growth on her scalp but has excessive facial hair growth. She has tried various treatments for hair growth, including Rogaine and other over-the-counter products, with no success. She also stopped taking her cholesterol medication. She is currently on a vitamin D supplement due to a deficiency. She has an IUD for menstrual symptom management and contraceptive purposes. She reports no major symptoms related to PCOS, apart from hirsutism, difficulty losing weight, buffalo hump       Hypertension, follow-up: She was last seen for hypertension 10 months ago.  BP at that visit was 118/80 Management includes no  medicine. She reports excellent compliance with treatment. She is not having side effects.  She is following a Regular diet. She is not exercising. She does not smoke.     Use of agents associated with hypertension: none.    Lipid/Cholesterol, Follow-up  Management includes none         She reports excellent compliance with treatment. She is not having side effects.   Depression, Follow-up  Current treatment includes zoloft.  She reports excellent compliance with treatment. She is not having side effects.  She reports good tolerance of treatment. Current symptoms include: fatigue She feels she is worse since last visit.     10/17/2023    3:22 PM 09/06/2022    3:50 PM 05/21/2022   10:54 AM 01/10/2022   10:33 AM  Depression screen PHQ 2/9  Decreased Interest 1 0 1 2  Down, Depressed, Hopeless 1 0 1 2  PHQ - 2 Score 2 0 2 4  Altered sleeping 1 3 1 3   Tired, decreased energy 2 1 1 3   Change in appetite 1 3 0 0  Feeling bad or failure about yourself  1 0 1 3  Trouble concentrating 1 1 1  0  Moving slowly or fidgety/restless 0 3 0 1  Suicidal thoughts 0 0 0 0  PHQ-9 Score 8 11 6 14   Difficult doing work/chores Somewhat difficult Not difficult at all Somewhat difficult Somewhat difficult        10/17/2023    3:22 PM  Fall Risk   Falls in the past year? 0  Number falls in past yr: 0  Injury with Fall? 0  Risk for fall due to : No Fall Risks  Follow up Education provided    Patient Care Team: Langley Gauss, [place] as PCP - General (Physician Assistant)   Review of Systems  Constitutional:  Negative for chills, fatigue and fever.  HENT:  Positive for congestion. Negative for ear pain and sore throat.   Respiratory:  Negative for cough and shortness of breath.   Cardiovascular:  Negative for chest pain.  Gastrointestinal:  Negative for abdominal pain, nausea and rectal pain.  Endocrine: Negative for polydipsia, polyphagia and polyuria.  Neurological:  Negative for  headaches.  Psychiatric/Behavioral:  Negative for agitation and behavioral problems.     Current Outpatient Medications on File Prior to Visit  Medication Sig Dispense Refill   acetaminophen (TYLENOL) 500 MG tablet Take 1,000 mg by mouth every 6 (six) hours as needed (for pain.).     Elderberry 500 MG CAPS Take by mouth.     sertraline (ZOLOFT) 100 MG tablet TAKE 1 TABLET BY MOUTH EVERY DAY 90 tablet 0   Vitamin D, Ergocalciferol, (DRISDOL) 1.25 MG (50000 UNIT) CAPS capsule TAKE 1 CAPSULE BY MOUTH EVERY 7 DAYS 10 capsule 1   spironolactone (ALDACTONE) 50 MG tablet Take 1 tablet (50 mg total) by mouth daily. (Patient not taking: Reported on 10/17/2023) 30 tablet 0   No current facility-administered medications on file prior to visit.   Past Medical History:  Diagnosis Date   Depression    Fibroid tumor    Right Breast Lumpectomy   Hypertension    Migraine    PCOS (polycystic ovarian syndrome)    Past Surgical History:  Procedure Laterality Date   BREAST LUMPECTOMY Right    Fibroid Tumor   CESAREAN SECTION      Family History  Problem Relation Age of Onset   Stroke Mother    Parkinson's disease Mother    Atrial fibrillation Mother    Cancer Father        breast, liver, spine and bone   Cancer Other        prostate, colon, liver, lung   Diabetes Other    Autism Child    Social History   Socioeconomic History   Marital status: Married    Spouse name: Glora Jeanphilippe   Number of children: 2   Years of education: Not on file   Highest education level: Not on file  Occupational History   Not on file  Tobacco Use   Smoking status: Former    Current packs/day: 0.00    Average packs/day: 0.5 packs/day for 4.0 years (2.0 ttl pk-yrs)    Types: Cigarettes    Start date: 08/15/2009    Quit date: 08/15/2013    Years since quitting: 10.1   Smokeless tobacco: Never  Vaping Use   Vaping status: Never Used  Substance and Sexual Activity   Alcohol use: Yes    Alcohol/week: 1.0  standard drink of alcohol    Types: 1 Standard drinks or equivalent per week    Comment: rarely   Drug use: Not Currently    Types: Marijuana    Comment: Used as a teenager   Sexual activity: Yes    Partners: Male    Birth control/protection: I.U.D.  Other Topics Concern   Not on file  Social History Narrative   ** Merged History Encounter **       Social Determinants of Health   Financial Resource Strain: Low Risk  (05/21/2022)   Overall Financial Resource Strain (CARDIA)  Difficulty of Paying Living Expenses: Not hard at all  Food Insecurity: No Food Insecurity (05/21/2022)   Hunger Vital Sign    Worried About Running Out of Food in the Last Year: Never true    Ran Out of Food in the Last Year: Never true  Transportation Needs: No Transportation Needs (05/21/2022)   PRAPARE - Administrator, Civil Service (Medical): No    Lack of Transportation (Non-Medical): No  Physical Activity: Inactive (05/21/2022)   Exercise Vital Sign    Days of Exercise per Week: 0 days    Minutes of Exercise per Session: 0 min  Stress: No Stress Concern Present (05/21/2022)   [name] - Occupational Stress Questionnaire    Feeling of Stress : Not at all  Social Connections: Moderately Isolated (05/21/2022)   Social Connection and Isolation Panel [NHANES]    Frequency of Communication with Friends and Family: More than three times a week    Frequency of Social Gatherings with Friends and Family: More than three times a week    Attends Religious Services: Never    Database administrator or Organizations: No    Attends Engineer, structural: Never    Marital Status: Married    Objective:  BP 118/80   Pulse 80   Temp 97.6 F (36.4 C)   Resp 14   Ht 5\' 6"  (1.676 m)   Wt 275 lb (124.7 kg)   LMP  (LMP Unknown)   SpO2 97%   BMI 44.39 kg/m      10/17/2023    3:11 PM 10/12/2022    7:48 AM 09/06/2022    3:44 PM  BP/Weight  Systolic BP 118  578 110  Diastolic BP 80 68 64  Wt. (Lbs) 275 264 270  BMI 44.39 kg/m2 42.61 kg/m2 43.58 kg/m2    Physical Exam Vitals reviewed.  Constitutional:      Appearance: Normal appearance.  HENT:     Head: Hair is abnormal.     Comments: Hirsutism  Cardiovascular:     Rate and Rhythm: Normal rate and regular rhythm.     Heart sounds: Normal heart sounds.  Pulmonary:     Effort: Pulmonary effort is normal.     Breath sounds: Normal breath sounds.  Abdominal:     General: Bowel sounds are normal.     Palpations: Abdomen is soft.     Tenderness: There is no abdominal tenderness.  Neurological:     Mental Status: She is alert and oriented to person, place, and time.  Psychiatric:        Mood and Affect: Mood normal.        Behavior: Behavior normal.     Diabetic Foot Exam - Simple   No data filed      Lab Results  Component Value Date   WBC 6.3 10/09/2022   HGB 15.0 10/09/2022   HCT 44.3 10/09/2022   PLT 238 10/09/2022   GLUCOSE 98 10/09/2022   CHOL 137 10/09/2022   TRIG 138 10/09/2022   HDL 42 10/09/2022   LDLCALC 71 10/09/2022   ALT 19 10/09/2022   AST 19 10/09/2022   NA 139 10/09/2022   K 4.4 10/09/2022   CL 105 10/09/2022   CREATININE 0.65 10/09/2022   BUN 19 10/09/2022   CO2 19 (L) 10/09/2022   TSH 1.320 10/09/2022   HGBA1C 5.5 06/18/2022      Assessment & Plan:    Essential hypertension Assessment &  Plan: Self-discontinued Diovan due to symptomatic hypotension. Current blood pressure controlled. -No further action required at this time.  Orders: -     CBC with Differential/Platelet; Future -     Comprehensive metabolic panel; Future  Mixed hyperlipidemia Assessment & Plan: Self-discontinued Crestor 5mg . -Plan to monitor lipid panel in 6 months.  Orders: -     Lipid panel; Future  Vitamin D deficiency Assessment & Plan: Labs drawn Will adjust treatment if low normal or abnormal  Orders: -     VITAMIN D 25 Hydroxy (Vit-D Deficiency,  Fractures); Future  PCOS (polycystic ovarian syndrome) Assessment & Plan: Self-discontinued Spironolactone due to perceived lack of efficacy in managing hair thinning. No other major symptoms reported. -Consider alternative treatments for hair thinning associated with PCOS after further research.  Orders: -     Vaniqa; Apply 1 Application topically 2 (two) times daily with a meal.  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 History of using Zyrtec and Claritin with moderate success. -Trial of Allegra D to be initiated.  Orders: -     Fexofenadine-Pseudoephed ER; Take 1 tablet by mouth daily.  Dispense: 30 tablet; Refill: 1  Other seborrheic dermatitis Assessment & Plan: History of using various topical treatments with limited success including corticosteroids, antifungals. -Initiate Zoryve for scalp psoriasis. Will reassess in 6 months  Orders: -     Zoryve; Apply 1 Application topically daily.  Dispense: 60 g; Refill: 2  Screening mammogram for breast cancer -     3D Screening Mammogram, Left and Right; Future     Meds ordered this encounter  Medications   fexofenadine-pseudoephedrine (ALLEGRA-D ALLERGY & CONGESTION) 180-240 MG 24 hr tablet    Sig: Take 1 tablet by mouth daily.    Dispense:  30 tablet    Refill:  1   Eflornithine HCl (VANIQA) 13.9 % cream    Sig: Apply 1 Application topically 2 (two) times daily with a meal.    Dispense:  45 g    Refill:  2   Roflumilast, Antiseborrheic, (ZORYVE) 0.3 % FOAM    Sig: Apply 1 Application topically daily.    Dispense:  60 g    Refill:  2    Orders Placed This Encounter  Procedures   MM 3D SCREENING  MAMMOGRAM BILATERAL BREAST   CBC with Differential/Platelet   Comprehensive metabolic panel   Hemoglobin A1c   Lipid panel   TSH   Vitamin B12   VITAMIN D 25 Hydroxy (Vit-D Deficiency, Fractures)   Cortisol Dexamethasone Reflex        Follow-up: Return for nurse visit.   I,Marla I Leal-Borjas,acting as a scribe for [employer], PA.,have documented all relevant documentation on the behalf of Langley Gauss, PA,as directed by  Langley Gauss, PA while in the presence of Langley Gauss, [place].   An After Visit Summary was printed and given to the patient.  Langley Gauss, [place] [facility] [phone]

## 2023-10-17 NOTE — Assessment & Plan Note (Signed)
Self-discontinued Crestor 5mg . -Plan to monitor lipid panel in 6 months.

## 2023-10-17 NOTE — Patient Instructions (Signed)
General Health Maintenance -Order labs including adrenal function, A1C, cholesterol, thyroid, B12, and Vitamin D. -Plan mammogram for November 13, 2023. -Schedule follow-up appointment in 6 months.

## 2023-10-17 NOTE — Assessment & Plan Note (Signed)
Labs drawn Will adjust treatment if low normal or abnormal

## 2023-10-18 ENCOUNTER — Other Ambulatory Visit: Payer: BC Managed Care – PPO

## 2023-10-18 ENCOUNTER — Other Ambulatory Visit: Payer: Self-pay | Admitting: Physician Assistant

## 2023-10-18 DIAGNOSIS — E661 Drug-induced obesity: Secondary | ICD-10-CM

## 2023-10-18 DIAGNOSIS — E559 Vitamin D deficiency, unspecified: Secondary | ICD-10-CM

## 2023-10-18 DIAGNOSIS — E782 Mixed hyperlipidemia: Secondary | ICD-10-CM

## 2023-10-18 DIAGNOSIS — I10 Essential (primary) hypertension: Secondary | ICD-10-CM

## 2023-10-19 LAB — COMPREHENSIVE METABOLIC PANEL
ALT: 25 [IU]/L (ref 0–32)
AST: 22 [IU]/L (ref 0–40)
Albumin: 4.4 g/dL (ref 3.9–4.9)
Alkaline Phosphatase: 87 [IU]/L (ref 44–121)
BUN/Creatinine Ratio: 19 (ref 9–23)
BUN: 14 mg/dL (ref 6–24)
Bilirubin Total: 0.4 mg/dL (ref 0.0–1.2)
CO2: 22 mmol/L (ref 20–29)
Calcium: 9.6 mg/dL (ref 8.7–10.2)
Chloride: 104 mmol/L (ref 96–106)
Creatinine, Ser: 0.75 mg/dL (ref 0.57–1.00)
Globulin, Total: 2.9 g/dL (ref 1.5–4.5)
Glucose: 124 mg/dL — ABNORMAL HIGH (ref 70–99)
Potassium: 4.8 mmol/L (ref 3.5–5.2)
Sodium: 141 mmol/L (ref 134–144)
Total Protein: 7.3 g/dL (ref 6.0–8.5)
eGFR: 103 mL/min/{1.73_m2} (ref >59)

## 2023-10-19 LAB — HEMOGLOBIN A1C
Est. average glucose Bld gHb Est-mCnc: 128 mg/dL
Hgb A1c MFr Bld: 6.1 % — ABNORMAL HIGH (ref 4.8–5.6)

## 2023-10-19 LAB — CBC WITH DIFFERENTIAL/PLATELET
Basophils Absolute: 0 10*3/uL (ref 0.0–0.2)
Basos: 1 %
EOS (ABSOLUTE): 0.1 10*3/uL (ref 0.0–0.4)
Eos: 1 %
Hematocrit: 48.3 % — ABNORMAL HIGH (ref 34.0–46.6)
Hemoglobin: 15.7 g/dL (ref 11.1–15.9)
Immature Grans (Abs): 0 10*3/uL (ref 0.0–0.1)
Immature Granulocytes: 0 %
Lymphocytes Absolute: 1.1 10*3/uL (ref 0.7–3.1)
Lymphs: 21 %
MCH: 29.2 pg (ref 26.6–33.0)
MCHC: 32.5 g/dL (ref 31.5–35.7)
MCV: 90 fL (ref 79–97)
Monocytes Absolute: 0.3 10*3/uL (ref 0.1–0.9)
Monocytes: 5 %
Neutrophils Absolute: 3.8 10*3/uL (ref 1.4–7.0)
Neutrophils: 72 %
Platelets: 271 10*3/uL (ref 150–450)
RBC: 5.37 x10E6/uL — ABNORMAL HIGH (ref 3.77–5.28)
RDW: 12.1 % (ref 11.7–15.4)
WBC: 5.3 10*3/uL (ref 3.4–10.8)

## 2023-10-19 LAB — DHEA-SULFATE: DHEA-SO4: 76.8 ug/dL (ref 57.3–279.2)

## 2023-10-19 LAB — LIPID PANEL
Chol/HDL Ratio: 4.6 ratio — ABNORMAL HIGH (ref 0.0–4.4)
Cholesterol, Total: 204 mg/dL — ABNORMAL HIGH (ref 100–199)
HDL: 44 mg/dL (ref >39)
LDL Chol Calc (NIH): 142 mg/dL — ABNORMAL HIGH (ref 0–99)
Triglycerides: 97 mg/dL (ref 0–149)
VLDL Cholesterol Cal: 18 mg/dL (ref 5–40)

## 2023-10-19 LAB — VITAMIN D 25 HYDROXY (VIT D DEFICIENCY, FRACTURES): Vit D, 25-Hydroxy: 27.4 ng/mL — ABNORMAL LOW (ref 30.0–100.0)

## 2023-10-19 LAB — VITAMIN B12: Vitamin B-12: 508 pg/mL (ref 232–1245)

## 2023-10-19 LAB — TSH: TSH: 0.825 u[IU]/mL (ref 0.450–4.500)

## 2023-10-19 LAB — ACTH: ACTH: 5.2 pg/mL — ABNORMAL LOW (ref 7.2–63.3)

## 2023-10-24 ENCOUNTER — Other Ambulatory Visit: Payer: Self-pay | Admitting: Physician Assistant

## 2023-10-24 DIAGNOSIS — R947 Abnormal results of other endocrine function studies: Secondary | ICD-10-CM

## 2023-10-24 DIAGNOSIS — E559 Vitamin D deficiency, unspecified: Secondary | ICD-10-CM

## 2023-10-24 MED ORDER — VITAMIN D (ERGOCALCIFEROL) 1.25 MG (50000 UNIT) PO CAPS: 50000.0000 [IU] | ORAL_CAPSULE | ORAL | 1 refills | Status: DC

## 2023-11-02 LAB — CORTISOL DEXAMETHASONE REFLEX: Cortisol, Serum LCMS: <1 ug/dL

## 2023-11-22 ENCOUNTER — Other Ambulatory Visit: Payer: Self-pay | Admitting: Physician Assistant

## 2023-11-22 DIAGNOSIS — F331 Major depressive disorder, recurrent, moderate: Secondary | ICD-10-CM

## 2023-12-31 ENCOUNTER — Ambulatory Visit: Payer: Self-pay | Admitting: Physician Assistant

## 2023-12-31 ENCOUNTER — Ambulatory Visit (INDEPENDENT_AMBULATORY_CARE_PROVIDER_SITE_OTHER): Payer: 59

## 2023-12-31 VITALS — HR 79 | Temp 97.6°F | Resp 14 | Ht 66.0 in | Wt 278.0 lb

## 2023-12-31 DIAGNOSIS — H6691 Otitis media, unspecified, right ear: Secondary | ICD-10-CM | POA: Diagnosis not present

## 2023-12-31 DIAGNOSIS — J069 Acute upper respiratory infection, unspecified: Secondary | ICD-10-CM | POA: Insufficient documentation

## 2023-12-31 MED ORDER — TRIAMCINOLONE ACETONIDE 40 MG/ML IJ SUSP
40.0000 mg | Freq: Once | INTRAMUSCULAR | Status: AC
  Administered 2023-12-31: 40 mg via INTRAMUSCULAR

## 2023-12-31 MED ORDER — TRIAMCINOLONE ACETONIDE 0.5 % EX OINT: 1.0000 | TOPICAL_OINTMENT | Freq: Two times a day (BID) | CUTANEOUS | 1 refills | Status: AC

## 2023-12-31 NOTE — Assessment & Plan Note (Signed)
Right ear infection with redness observed. Left ear appears normal. Currently on cefdinir, which seems to be helping. Informed that the antibiotic is appropriate for the ear infection. - Continue cefdinir 300 mg twice daily for the remaining 6.5 days

## 2023-12-31 NOTE — Progress Notes (Signed)
Acute Office Visit  Subjective:    Patient ID: Virginia Brock, female    DOB: January 19, 1981, 43 y.o.   MRN: [id number]  No chief complaint on file.   Discussed the use of AI scribe software for clinical note transcription with the patient, who gave verbal consent to proceed.      HPI: The patient presents with symptoms of a respiratory infection and ear infection.  She has been feeling unwell since Thursday, now day six of her illness, with an initial fever of 103F, coughing, and body aches. She describes a sensation of heaviness in her chest and difficulty breathing, although she is aware she is breathing adequately. A rattling sensation is noted in her chest when breathing deeply. Intermittent coughing fits, particularly troublesome at night, require her to prop herself up to sleep. Cough drops are used to manage symptoms. Fever persisted until Sunday night or Monday morning despite starting antibiotics on Sunday. Neck soreness, particularly at the back, is present, but no swollen lymph nodes are noted.  She visited urgent care on Sunday and was diagnosed with an right ear infection and bronchitis. She was prescribed Omnicef 300 mg capsules to be taken twice daily and has completed two and a half days of the course with six and a half days remaining. Promethazine with dextromethorphan cough syrup was also prescribed, which helps but causes significant drowsiness.  Regarding her past medical history, she was previously on spironolactone 50 mg for polycystic ovary syndrome but discontinued it due to ineffectiveness. An upcoming appointment with an endocrinologist in April is scheduled to explore alternative treatments. She also mentions having psoriasis, with notable spots on her right thumb and right index finger, for which she uses triamcinolone cream.   Past Medical History:  Diagnosis Date   Depression    Fibroid tumor    Right Breast Lumpectomy   Hypertension    Migraine     PCOS (polycystic ovarian syndrome)     Past Surgical History:  Procedure Laterality Date   BREAST LUMPECTOMY Right    Fibroid Tumor   CESAREAN SECTION      Family History  Problem Relation Age of Onset   Stroke Mother    Parkinson's disease Mother    Atrial fibrillation Mother    Cancer Father        breast, liver, spine and bone   Cancer Other        prostate, colon, liver, lung   Diabetes Other    Autism Child     Social History   Socioeconomic History   Marital status: Married    Spouse name: Virginia Brock   Number of children: 2   Years of education: Not on file   Highest education level: Not on file  Occupational History   Not on file  Tobacco Use   Smoking status: Former    Current packs/day: 0.00    Average packs/day: 0.5 packs/day for 4.0 years (2.0 ttl pk-yrs)    Types: Cigarettes    Start date: 08/15/2009    Quit date: 08/15/2013    Years since quitting: 10.3   Smokeless tobacco: Never  Vaping Use   Vaping status: Never Used  Substance and Sexual Activity   Alcohol use: Yes    Alcohol/week: 1.0 standard drink of alcohol    Types: 1 Standard drinks or equivalent per week    Comment: rarely   Drug use: Not Currently    Types: Marijuana    Comment: Used as a teenager  Sexual activity: Yes    Partners: Male    Birth control/protection: I.U.D.  Other Topics Concern   Not on file  Social History Narrative   ** Merged History Encounter **       Social Drivers of Health   Financial Resource Strain: Low Risk  (05/21/2022)   Overall Financial Resource Strain (CARDIA)    Difficulty of Paying Living Expenses: Not hard at all  Food Insecurity: No Food Insecurity (05/21/2022)   Hunger Vital Sign    Worried About Running Out of Food in the Last Year: Never true    Ran Out of Food in the Last Year: Never true  Transportation Needs: No Transportation Needs (05/21/2022)   PRAPARE - Administrator, Civil Service (Medical): No    Lack of  Transportation (Non-Medical): No  Physical Activity: Inactive (05/21/2022)   Exercise Vital Sign    Days of Exercise per Week: 0 days    Minutes of Exercise per Session: 0 min  Stress: No Stress Concern Present (05/21/2022)   [name] - Occupational Stress Questionnaire    Feeling of Stress : Not at all  Social Connections: Moderately Isolated (05/21/2022)   Social Connection and Isolation Panel [NHANES]    Frequency of Communication with Friends and Family: More than three times a week    Frequency of Social Gatherings with Friends and Family: More than three times a week    Attends Religious Services: Never    Database administrator or Organizations: No    Attends Banker Meetings: Never    Marital Status: Married  Catering manager Violence: Not At Risk (05/21/2022)   Humiliation, Afraid, Rape, and Kick questionnaire    Fear of Current or Ex-Partner: No    Emotionally Abused: No    Physically Abused: No    Sexually Abused: No    Outpatient Medications Prior to Visit  Medication Sig Dispense Refill   acetaminophen (TYLENOL) 500 MG tablet Take 1,000 mg by mouth every 6 (six) hours as needed (for pain.).     cefdinir (OMNICEF) 300 MG capsule Take 300 mg by mouth 2 (two) times daily.     promethazine-dextromethorphan (PROMETHAZINE-DM) 6.25-15 MG/5ML syrup Take by mouth.     sertraline (ZOLOFT) 100 MG tablet TAKE 1 TABLET BY MOUTH EVERY DAY 90 tablet 1   Vitamin D, Ergocalciferol, (DRISDOL) 1.25 MG (50000 UNIT) CAPS capsule Take 1 capsule (50,000 Units total) by mouth every 7 (seven) days. 10 capsule 1   Eflornithine HCl (VANIQA) 13.9 % cream Apply 1 Application topically 2 (two) times daily with a meal. 45 g 2   Elderberry 500 MG CAPS Take by mouth.     fexofenadine-pseudoephedrine (ALLEGRA-D ALLERGY & CONGESTION) 180-240 MG 24 hr tablet Take 1 tablet by mouth daily. 30 tablet 1   Roflumilast, Antiseborrheic, (ZORYVE) 0.3 % FOAM Apply 1  Application topically daily. 60 g 2   spironolactone (ALDACTONE) 50 MG tablet Take 1 tablet (50 mg total) by mouth daily. 30 tablet 0   No facility-administered medications prior to visit.    Allergies  Allergen Reactions   Buprenorphine Hcl Hives, Other (See Comments) and Rash    Chest pain  Other Reaction(s): Other (See Comments), Other (See Comments)    Chest pain    Chest pain Chest pain   Morphine Hives and Other (See Comments)    Chest Pain  Other Reaction(s): Chest Pain, Other (See Comments), Other (See Comments)    Chest Pain  Other Reaction(s): Other (See Comments)    Chest pain   Dermatitis Antigen Rash    Blisters   Latex Other (See Comments) and Rash    Blisters when wearing latex gloves or condoms but can tolerate if touched briefly by someone with latex glove on  Other Reaction(s): Other (See Comments), Other (See Comments)    Blisters when wearing latex gloves or condoms but can tolerate if touched briefly by someone with latex glove on    Blisters when wearing latex gloves or condoms but can tolerate if touched briefly by someone with latex glove on Blisters when wearing latex gloves or condoms but can tolerate if touched briefly by someone with latex glove on Blisters when wearing latex gloves or condoms but can tolerate if touched briefly by someone with latex glove on   Morphine And Codeine Hives and Other (See Comments)    Chest pain    Adhesive [Tape] Other (See Comments)    Blisters     Review of Systems  Constitutional:  Negative for chills, fatigue and fever.  HENT:  Positive for congestion, ear pain and postnasal drip. Negative for sore throat.   Respiratory:  Positive for cough and shortness of breath.   Cardiovascular:  Negative for chest pain and palpitations.  Gastrointestinal:  Negative for abdominal pain, constipation, diarrhea, nausea and vomiting.  Endocrine: Negative for polydipsia, polyphagia and polyuria.  Genitourinary:   Negative for difficulty urinating and dysuria.  Musculoskeletal:  Negative for arthralgias, back pain and myalgias.  Skin:  Negative for rash.  Neurological:  Negative for headaches.  Psychiatric/Behavioral:  Negative for dysphoric mood. The patient is not nervous/anxious.        Objective:        12/31/2023   10:25 AM 10/17/2023    3:11 PM 10/12/2022    7:48 AM  Vitals with BMI  Height 5\' 6"  5\' 6"  5\' 6"   Weight 278 lbs 275 lbs 264 lbs  BMI 44.89 44.41 42.63  Systolic  118 130  Diastolic  80 68  Pulse 79 80 85    No data found.   Physical Exam Vitals and nursing note reviewed.  Constitutional:      Appearance: She is obese.  HENT:     Head: Normocephalic and atraumatic.     Ears:     Comments: Mild erythema of the right TM noted    Nose: Congestion present.     Mouth/Throat:     Pharynx: Posterior oropharyngeal erythema present.     Comments: Some post nasal drainage noted Cardiovascular:     Rate and Rhythm: Normal rate and regular rhythm.  Pulmonary:     Effort: Pulmonary effort is normal.     Breath sounds: Wheezing (occasional wheeze noted) present.  Musculoskeletal:     Cervical back: Normal range of motion and neck supple.  Skin:    Findings: Rash (eczematous rash noted on right index finger and pointing finger) present.  Neurological:     Mental Status: She is alert.  Psychiatric:        Mood and Affect: Mood normal.     Health Maintenance Due  Topic Date Due   INFLUENZA VACCINE  07/04/2023   COVID-19 Vaccine (4 - 2024-25 season) 08/04/2023    There are no preventive care reminders to display for this patient.   Lab Results  Component Value Date   TSH 0.825 10/18/2023   Lab Results  Component Value Date   WBC 5.3 10/18/2023   HGB  15.7 10/18/2023   HCT 48.3 (H) 10/18/2023   MCV 90 10/18/2023   PLT 271 10/18/2023   Lab Results  Component Value Date   NA 141 10/18/2023   K 4.8 10/18/2023   CO2 22 10/18/2023   GLUCOSE 124 (H)  10/18/2023   BUN 14 10/18/2023   CREATININE 0.75 10/18/2023   BILITOT 0.4 10/18/2023   ALKPHOS 87 10/18/2023   AST 22 10/18/2023   ALT 25 10/18/2023   PROT 7.3 10/18/2023   ALBUMIN 4.4 10/18/2023   CALCIUM 9.6 10/18/2023   EGFR 103 10/18/2023   Lab Results  Component Value Date   CHOL 204 (H) 10/18/2023   Lab Results  Component Value Date   HDL 44 10/18/2023   Lab Results  Component Value Date   LDLCALC 142 (H) 10/18/2023   Lab Results  Component Value Date   TRIG 97 10/18/2023   Lab Results  Component Value Date   CHOLHDL 4.6 (H) 10/18/2023   Lab Results  Component Value Date   HGBA1C 6.1 (H) 10/18/2023       Assessment & Plan:  URI with cough and congestion Assessment & Plan: Acute bronchitis with symptoms starting six days ago, including fever (up to 103F), cough, body aches, chest heaviness, and occasional wheezing. Examination reveals postnasal drainage and occasional wheeze, but no signs of pneumonia. Currently on cefdinir 300 mg twice daily and promethazine with dextromethorphan. Discussed that cough is the last symptom to resolve and that the viral infection will take a week or two to settle down. Informed that Kenalog injection may help with inflammation but will not provide immediate relief. - Administer Kenalog injection for inflammation - Continue cefdinir 300 mg twice daily for the remaining 6.5 days - Continue promethazine with dextromethorphan as needed for cough - Recommend Mucinex for mucus clearance - Encourage increased fluid intake, warm soup, gargling, and steam inhalation - Advise to monitor for increased shortness of breath and return if symptoms worsen for potential chest x-ray   Right otitis media, unspecified otitis media type Assessment & Plan: Right ear infection with redness observed. Left ear appears normal. Currently on cefdinir, which seems to be helping. Informed that the antibiotic is appropriate for the ear infection. - Continue  cefdinir 300 mg twice daily for the remaining 6.5 days   Other orders -     Triamcinolone Acetonide; Apply 1 Application topically 2 (two) times daily.  Dispense: 30 g; Refill: 1     Meds ordered this encounter  Medications   triamcinolone ointment (KENALOG) 0.5 %    Sig: Apply 1 Application topically 2 (two) times daily.    Dispense:  30 g    Refill:  1    No orders of the defined types were placed in this encounter.    Follow-up: Return if symptoms worsen or fail to improve.  An After Visit Summary was printed and given to the patient.  Windell Moment, MD [facility] [phone]

## 2023-12-31 NOTE — Assessment & Plan Note (Signed)
Acute bronchitis with symptoms starting six days ago, including fever (up to 103F), cough, body aches, chest heaviness, and occasional wheezing. Examination reveals postnasal drainage and occasional wheeze, but no signs of pneumonia. Currently on cefdinir 300 mg twice daily and promethazine with dextromethorphan. Discussed that cough is the last symptom to resolve and that the viral infection will take a week or two to settle down. Informed that Kenalog injection may help with inflammation but will not provide immediate relief. - Administer Kenalog injection for inflammation - Continue cefdinir 300 mg twice daily for the remaining 6.5 days - Continue promethazine with dextromethorphan as needed for cough - Recommend Mucinex for mucus clearance - Encourage increased fluid intake, warm soup, gargling, and steam inhalation - Advise to monitor for increased shortness of breath and return if symptoms worsen for potential chest x-ray

## 2023-12-31 NOTE — Patient Instructions (Signed)
  VISIT SUMMARY:  You came in today with symptoms of a respiratory infection and ear infection. You have been feeling unwell for six days with a high fever, cough, body aches, and chest heaviness. You were previously diagnosed with bronchitis and an ear infection at urgent care and started on antibiotics and cough syrup. We discussed your current symptoms and treatment plan, including your ongoing issues with psoriasis and polycystic ovary syndrome (PCOS).  YOUR PLAN:  -ACUTE BRONCHITIS: Acute bronchitis is an inflammation of the bronchial tubes in the lungs, usually caused by a viral infection. You should continue taking cefdinir 300 mg twice daily for the remaining 6.5 days and use promethazine with dextromethorphan as needed for your cough. We administered a Kenalog injection to help with inflammation. Additionally, you can take Mucinex to help clear mucus, drink plenty of fluids, have warm soup, gargle, and use steam inhalation. Please monitor for increased shortness of breath and return if symptoms worsen for a potential chest x-ray.  -RIGHT EAR INFECTION: An ear infection is an infection of the middle ear, causing pain and redness. You should continue taking cefdinir 300 mg twice daily for the remaining 6.5 days as it is appropriate for treating your ear infection.  -PSORIASIS: Psoriasis is a skin condition that causes red, itchy, and scaly patches. We have refilled your prescription for triamcinolone cream to help manage your symptoms. The Kenalog injection may also help with your psoriasis.  -POLYCYSTIC OVARY SYNDROME (PCOS): PCOS is a hormonal disorder causing enlarged ovaries with small cysts. You have discontinued spironolactone due to ineffectiveness and have an upcoming appointment with an endocrinologist in April to explore alternative treatments.  INSTRUCTIONS:  Please continue taking your medications as prescribed and follow the additional recommendations provided. Monitor your  symptoms, especially for any increased shortness of breath, and return if they worsen. Follow up with your endocrinologist in April for further management of PCOS.

## 2023-12-31 NOTE — Telephone Encounter (Signed)
  Chief Complaint: bronchitis not improved on antibiotics Symptoms: productive cough, mild SOB, chest burns, fevers Frequency: x 6 days Pertinent Negatives: Patient denies stuffy nose, sore throat Disposition: [] ED /[] Urgent Care (no appt availability in office) / [x] Appointment(In office/virtual)/ []  [facility]/ [] Home Care/ [] Refused Recommended Disposition /[] [place] [facility]/ []  Follow-up with PCP Additional Notes: Offered appointments today with her PCP; patient requesting earlier appointment with any provider.   Copied from CRM [id number]. Topic: Appointments - Appointment Scheduling >> Dec 31, 2023  7:46 AM Payton Doughty wrote: Patient/patient representative is calling to schedule an appointment. Refer to attachments for appointment information. However pt went to UC and not better.  Finds it hard to breathe and SOB Reason for Disposition  [1] MILD difficulty breathing (e.g., minimal/no SOB at rest, SOB with walking, pulse <100) AND [2] still present when not coughing  Answer Assessment - Initial Assessment Questions 1. ONSET: "When did the cough begin?"      Since Thursday.  2. SEVERITY: "How bad is the cough today?"      Coughing so hard she states she will cough so hard she pees on herself.  3. SPUTUM: "Describe the color of your sputum" (none, dry cough; clear, white, yellow, green)     Yellow/green/brown.  4. HEMOPTYSIS: "Are you coughing up any blood?" If so ask: "How much?" (flecks, streaks, tablespoons, etc.)     Denies.  5. DIFFICULTY BREATHING: "Are you having difficulty breathing?" If Yes, ask: "How bad is it?" (e.g., mild, moderate, severe)    - MILD: No SOB at rest, mild SOB with walking, speaks normally in sentences, can lie down, no retractions, pulse < 100.    - MODERATE: SOB at rest, SOB with minimal exertion and prefers to sit, cannot lie down flat, speaks in phrases, mild retractions, audible wheezing, pulse 100-120.    - SEVERE: Very SOB at rest,  speaks in single words, struggling to breathe, sitting hunched forward, retractions, pulse > 120      If I talk a lot or if lie down flat then I feel SOB. "Mild to moderate."  6. FEVER: "Do you have a fever?" If Yes, ask: "What is your temperature, how was it measured, and when did it start?"     Last fever was Sunday or Monday and states have been as high as 103.  7. CARDIAC HISTORY: "Do you have any history of heart disease?" (e.g., heart attack, congestive heart failure)      High blood pressure.  8. LUNG HISTORY: "Do you have any history of lung disease?"  (e.g., pulmonary embolus, asthma, emphysema)     Denies.  9. PE RISK FACTORS: "Do you have a history of blood clots?" (or: recent major surgery, recent prolonged travel, bedridden)     Denies.  10. OTHER SYMPTOMS: "Do you have any other symptoms?" (e.g., runny nose, wheezing, chest pain)       Back pain, chest burns.  11. PREGNANCY: "Is there any chance you are pregnant?" "When was your last menstrual period?"       LMP unsure due to she has IUD.  12. TRAVEL: "Have you traveled out of the country in the last month?" (e.g., travel history, exposures)       Denies.  Protocols used: Cough - Acute Productive-A-AH

## 2023-12-31 NOTE — Telephone Encounter (Signed)
Copied from CRM [id number]. Topic: General - Other >> Dec 31, 2023 11:28 AM Dennison Nancy wrote: Reason for CRM: patient returning nurse call , nurse want to the name of urgent care patient went to :  [facility] urgent care in [place] on [address]

## 2024-01-09 ENCOUNTER — Ambulatory Visit: Payer: Self-pay | Admitting: Physician Assistant

## 2024-01-09 ENCOUNTER — Other Ambulatory Visit: Payer: Self-pay | Admitting: Physician Assistant

## 2024-01-09 DIAGNOSIS — J069 Acute upper respiratory infection, unspecified: Secondary | ICD-10-CM

## 2024-01-09 NOTE — Telephone Encounter (Signed)
 This RN made an outbound call to the patient to attempt to triage. Patient declined triage and stated she was on her way to an UC. Advised patient to call back if anything changes. Patient complied.   Copied from CRM [id number]. Topic: Clinical - Medical Advice >> Jan 09, 2024 10:13 AM Graeme ORN wrote: Reason for CRM: Patient called states she was seen recently as follow up to urgent care but she is not getting better.  She does not have a fever but states dry cough is getting worse. She wants to know if anything else provider can suggest or do. Thank You Reason for Disposition  [1] Follow-up call to recent contact AND [2] information only call, no triage required  Protocols used: Information Only Call - No Triage-A-AH

## 2024-01-16 ENCOUNTER — Other Ambulatory Visit: Payer: Self-pay | Admitting: Physician Assistant

## 2024-01-16 DIAGNOSIS — B3731 Acute candidiasis of vulva and vagina: Secondary | ICD-10-CM

## 2024-01-16 MED ORDER — FLUCONAZOLE 150 MG PO TABS: 150.0000 mg | ORAL_TABLET | Freq: Once | ORAL | 0 refills | Status: AC

## 2024-01-16 NOTE — Telephone Encounter (Signed)
Called and requested UC notes yesterday. Spoke with patient today who stated UC did not do a xray but gave her another round of ax bx, however she is feeling better, no fever, chills and coughing has improved. Stated she has lost her voice and it seems to come and go. Informed her that since she is reporting to be improving that Huston Foley will wait on ordering a cx xray, she requested that diflucan be sent in for a yeast infection. Per Huston Foley will send in rx. Patient stated that if her voice does not improve she would send a message, patient informed that should her voice not improve by the end of next week to call our office to schedule an appointment to be evaluated in person. Patient verbalized understanding.

## 2024-02-14 ENCOUNTER — Encounter: Payer: Self-pay | Admitting: Physician Assistant

## 2024-02-14 ENCOUNTER — Ambulatory Visit (INDEPENDENT_AMBULATORY_CARE_PROVIDER_SITE_OTHER): Admitting: Family Medicine

## 2024-02-14 ENCOUNTER — Encounter: Payer: Self-pay | Admitting: Family Medicine

## 2024-02-14 VITALS — BP 130/84 | HR 67 | Temp 98.8°F | Resp 16 | Ht 66.0 in | Wt 276.6 lb

## 2024-02-14 DIAGNOSIS — J040 Acute laryngitis: Secondary | ICD-10-CM

## 2024-02-14 DIAGNOSIS — M25511 Pain in right shoulder: Secondary | ICD-10-CM | POA: Diagnosis not present

## 2024-02-14 DIAGNOSIS — R030 Elevated blood-pressure reading, without diagnosis of hypertension: Secondary | ICD-10-CM | POA: Insufficient documentation

## 2024-02-14 MED ORDER — KETOROLAC TROMETHAMINE 60 MG/2ML IM SOLN
60.0000 mg | Freq: Once | INTRAMUSCULAR | Status: AC
  Administered 2024-02-14: 60 mg via INTRAMUSCULAR

## 2024-02-14 MED ORDER — VALSARTAN 40 MG PO TABS: 40.0000 mg | ORAL_TABLET | Freq: Every day | ORAL | 3 refills | Status: AC

## 2024-02-14 MED ORDER — PREDNISONE 20 MG PO TABS: ORAL_TABLET | ORAL | 0 refills | Status: AC

## 2024-02-14 NOTE — Telephone Encounter (Signed)
 Patient was called. Appointment was made for today at 11:20 with Norwood Levo.

## 2024-02-14 NOTE — Assessment & Plan Note (Signed)
 Acute Shoulder pain with limited range of motion, possibly due to sleeping position. Informed about prednisone side effects, agreed to proceed. - Administer Toradol injection for pain relief. - Prescribe prednisone taper dose pack for inflammation. - Advise use of heat as needed. - Recommend exercises for shoulder mobility.

## 2024-02-14 NOTE — Progress Notes (Signed)
 Subjective:  Patient ID: Virginia Brock, female    DOB: [birth year]  Age: 43 y.o. MRN: [id number]  Chief Complaint  Patient presents with   Hypertension    Discussed the use of AI scribe software for clinical note transcription with the patient, who gave verbal consent to proceed.  HPI Virginia Brock is a 43 year old female with history of hypertension who presents with elevated blood pressure and shoulder pain and neck pain.  She has been experiencing elevated blood pressure readings at home, with recent measurements of 128/86 mmHg and 130/84 mmHg. Her usual blood pressure is typically in the 110s/70s. She has not taken Diovan since September, previously on 80 mg. She is concerned about her blood pressure being high for her, despite it not being significantly out of range. She experiences a sensation of hearing her heartbeat in her right ear, which she identifies as a sign of elevated blood pressure. No chest pain, shortness of breath, or headaches.  She has shoulder pain that radiates down her arm, which began after sleeping on it awkwardly. The pain is constant and worsens with movement, particularly when raising her arm. She has tried ibuprofen and a heating pad for relief. No recent fever, sinus pain, or ear pain.  She mentions having laryngitis three weeks ago, with her voice not yet fully recovered, and experiences an occasional cough to clear her throat.  She is currently taking Zoloft at night and has a vitamin D deficiency for which she takes supplements weekly when she remembers. She also uses a cream for psoriasis. She denies any recent changes in sleep patterns.     10/17/2023    3:22 PM 09/06/2022    3:50 PM 05/21/2022   10:54 AM 01/10/2022   10:33 AM  Depression screen PHQ 2/9  Decreased Interest 1 0 1 2  Down, Depressed, Hopeless 1 0 1 2  PHQ - 2 Score 2 0 2 4  Altered sleeping 1 3 1 3   Tired, decreased energy 2 1 1 3   Change in appetite 1 3 0 0  Feeling bad or  failure about yourself  1 0 1 3  Trouble concentrating 1 1 1  0  Moving slowly or fidgety/restless 0 3 0 1  Suicidal thoughts 0 0 0 0  PHQ-9 Score 8 11 6 14   Difficult doing work/chores Somewhat difficult Not difficult at all Somewhat difficult Somewhat difficult        10/17/2023    3:22 PM  Fall Risk   Falls in the past year? 0  Number falls in past yr: 0  Injury with Fall? 0  Risk for fall due to : No Fall Risks  Follow up Education provided    Patient Care Team: Langley Gauss, [place] as PCP - General (Physician Assistant)   Review of Systems  Constitutional:  Negative for chills, diaphoresis, fatigue and fever.  HENT:  Positive for voice change. Negative for congestion, ear pain and sinus pain.        Right ear - can hear her BP pounding sometimes  Eyes: Negative.   Respiratory:  Positive for cough (occasional). Negative for shortness of breath.   Cardiovascular:  Negative for chest pain.  Gastrointestinal:  Negative for abdominal pain, constipation, nausea and vomiting.  Endocrine: Negative.   Genitourinary:  Negative for dysuria.  Musculoskeletal:  Positive for arthralgias (right shoulder pain) and neck pain.  Skin: Negative.   Neurological:  Negative for weakness and headaches.  Hematological: Negative.   Psychiatric/Behavioral:  Negative for dysphoric mood and sleep disturbance. The patient is nervous/anxious.     Current Outpatient Medications on File Prior to Visit  Medication Sig Dispense Refill   acetaminophen (TYLENOL) 500 MG tablet Take 1,000 mg by mouth every 6 (six) hours as needed (for pain.).     albuterol (VENTOLIN HFA) 108 (90 Base) MCG/ACT inhaler Inhale 2 puffs into the lungs every 4 (four) hours as needed for wheezing or shortness of breath (as needed).     sertraline (ZOLOFT) 100 MG tablet TAKE 1 TABLET BY MOUTH EVERY DAY 90 tablet 1   triamcinolone ointment (KENALOG) 0.5 % Apply 1 Application topically 2 (two) times daily. 30 g 1   Vitamin D,  Ergocalciferol, (DRISDOL) 1.25 MG (50000 UNIT) CAPS capsule Take 1 capsule (50,000 Units total) by mouth every 7 (seven) days. 10 capsule 1   No current facility-administered medications on file prior to visit.   Past Medical History:  Diagnosis Date   Depression    Fibroid tumor    Right Breast Lumpectomy   Hypertension    Migraine    PCOS (polycystic ovarian syndrome)    Past Surgical History:  Procedure Laterality Date   BREAST LUMPECTOMY Right    Fibroid Tumor   CESAREAN SECTION      Family History  Problem Relation Age of Onset   Stroke Mother    Parkinson's disease Mother    Atrial fibrillation Mother    Cancer Father        breast, liver, spine and bone   Cancer Other        prostate, colon, liver, lung   Diabetes Other    Autism Child    Social History   Socioeconomic History   Marital status: Married    Spouse name: Ahmyah Gidley   Number of children: 2   Years of education: Not on file   Highest education level: Not on file  Occupational History   Not on file  Tobacco Use   Smoking status: Former    Current packs/day: 0.00    Average packs/day: 0.5 packs/day for 4.0 years (2.0 ttl pk-yrs)    Types: Cigarettes    Start date: 08/15/2009    Quit date: 08/15/2013    Years since quitting: 10.5   Smokeless tobacco: Never  Vaping Use   Vaping status: Never Used  Substance and Sexual Activity   Alcohol use: Yes    Alcohol/week: 1.0 standard drink of alcohol    Types: 1 Standard drinks or equivalent per week    Comment: rarely   Drug use: Not Currently    Types: Marijuana    Comment: Used as a teenager   Sexual activity: Yes    Partners: Male    Birth control/protection: I.U.D.  Other Topics Concern   Not on file  Social History Narrative   ** Merged History Encounter **       Social Drivers of Health   Financial Resource Strain: Low Risk  (05/21/2022)   Overall Financial Resource Strain (CARDIA)    Difficulty of Paying Living Expenses: Not hard  at all  Food Insecurity: No Food Insecurity (05/21/2022)   Hunger Vital Sign    Worried About Running Out of Food in the Last Year: Never true    Ran Out of Food in the Last Year: Never true  Transportation Needs: No Transportation Needs (05/21/2022)   PRAPARE - Administrator, Civil Service (Medical): No    Lack of Transportation (Non-Medical): No  Physical Activity: Inactive (05/21/2022)   Exercise Vital Sign    Days of Exercise per Week: 0 days    Minutes of Exercise per Session: 0 min  Stress: No Stress Concern Present (05/21/2022)   [name] - Occupational Stress Questionnaire    Feeling of Stress : Not at all  Social Connections: Moderately Isolated (05/21/2022)   Social Connection and Isolation Panel [NHANES]    Frequency of Communication with Friends and Family: More than three times a week    Frequency of Social Gatherings with Friends and Family: More than three times a week    Attends Religious Services: Never    Database administrator or Organizations: No    Attends Engineer, structural: Never    Marital Status: Married    Objective:  BP 130/84 (BP Location: Right Arm, Patient Position: Sitting, Cuff Size: Large)   Pulse 67   Temp 98.8 F (37.1 C) (Temporal)   Resp 16   Ht 5\' 6"  (1.676 m)   Wt 276 lb 9.6 oz (125.5 kg)   SpO2 96%   BMI 44.64 kg/m      02/14/2024   11:27 AM 02/14/2024   11:05 AM 12/31/2023   10:25 AM  BP/Weight  Systolic BP 130 128   Diastolic BP 84 86   Wt. (Lbs)  276.6 278  BMI  44.64 kg/m2 44.87 kg/m2    Physical Exam Vitals reviewed.  Constitutional:      General: She is not in acute distress.    Appearance: Normal appearance. She is not ill-appearing.  Eyes:     Conjunctiva/sclera: Conjunctivae normal.  Cardiovascular:     Rate and Rhythm: Normal rate and regular rhythm.     Heart sounds: Normal heart sounds. No murmur heard. Pulmonary:     Effort: Pulmonary effort is normal.      Breath sounds: Normal breath sounds. No wheezing.  Musculoskeletal:     Right shoulder: Decreased range of motion.  Neurological:     Mental Status: She is alert. Mental status is at baseline.  Psychiatric:        Mood and Affect: Mood normal.        Behavior: Behavior normal.       Lab Results  Component Value Date   WBC 5.3 10/18/2023   HGB 15.7 10/18/2023   HCT 48.3 (H) 10/18/2023   PLT 271 10/18/2023   GLUCOSE 124 (H) 10/18/2023   CHOL 204 (H) 10/18/2023   TRIG 97 10/18/2023   HDL 44 10/18/2023   LDLCALC 142 (H) 10/18/2023   ALT 25 10/18/2023   AST 22 10/18/2023   NA 141 10/18/2023   K 4.8 10/18/2023   CL 104 10/18/2023   CREATININE 0.75 10/18/2023   BUN 14 10/18/2023   CO2 22 10/18/2023   TSH 0.825 10/18/2023   HGBA1C 6.1 (H) 10/18/2023      Assessment & Plan:   Elevated blood pressure reading Assessment & Plan: Elevated blood pressure readings, likely influenced by stress and pain. Prefers to restart Diovan at a lower dose. BP Readings from Last 3 Encounters:  02/14/24 130/84  10/17/23 118/80  10/12/22 130/68    - Prescribe Diovan 40 mg once daily. - Monitor blood pressure at home. - FU in 2 weeks  Orders: -     Valsartan; Take 1 tablet (40 mg total) by mouth daily.  Dispense: 90 tablet; Refill: 3  Acute pain of right shoulder Assessment & Plan: Acute Shoulder pain with  limited range of motion, possibly due to sleeping position. Informed about prednisone side effects, agreed to proceed. - Administer Toradol injection for pain relief. - Prescribe prednisone taper dose pack for inflammation. - Advise use of heat as needed. - Recommend exercises for shoulder mobility.   Orders: -     Ketorolac Tromethamine -     predniSONE; Take 3 tablets (60 mg total) by mouth daily with breakfast for 3 days, THEN 2 tablets (40 mg total) daily with breakfast for 3 days, THEN 1 tablet (20 mg total) daily with breakfast for 3 days.  Dispense: 18 tablet; Refill:  0  Laryngitis, acute Assessment & Plan: Persistent laryngitis for three weeks, prednisone may aid recovery. - Try honey and lemon for sore throat - Gargle with warm salt water - Try hot tea to soothe the throat       Meds ordered this encounter  Medications   ketorolac (TORADOL) injection 60 mg   valsartan (DIOVAN) 40 MG tablet    Sig: Take 1 tablet (40 mg total) by mouth daily.    Dispense:  90 tablet    Refill:  3   predniSONE (DELTASONE) 20 MG tablet    Sig: Take 3 tablets (60 mg total) by mouth daily with breakfast for 3 days, THEN 2 tablets (40 mg total) daily with breakfast for 3 days, THEN 1 tablet (20 mg total) daily with breakfast for 3 days.    Dispense:  18 tablet    Refill:  0    No orders of the defined types were placed in this encounter.    Follow-up: Return in about 2 weeks (around 02/28/2024) for BP recheck, med check.  An After Visit Summary was printed and given to the patient.  Total time spent on today's visit was 30 minutes, including both face-to-face time and nonface-to-face time personally spent on review of chart (labs and imaging), discussing labs and goals, discussing further work-up, treatment options, referrals to specialist if needed, reviewing outside records if pertinent, answering patient's questions, and coordinating care.    Lajuana Matte, FNP [facility] [phone]

## 2024-02-14 NOTE — Assessment & Plan Note (Signed)
 Persistent laryngitis for three weeks, prednisone may aid recovery. - Try honey and lemon for sore throat - Gargle with warm salt water - Try hot tea to soothe the throat

## 2024-02-14 NOTE — Addendum Note (Signed)
 Addended by: Renne Crigler on: 02/14/2024 11:44 AM   Modules accepted: Level of Service

## 2024-02-14 NOTE — Assessment & Plan Note (Signed)
 Elevated blood pressure readings, likely influenced by stress and pain. Prefers to restart Diovan at a lower dose. BP Readings from Last 3 Encounters:  02/14/24 130/84  10/17/23 118/80  10/12/22 130/68    - Prescribe Diovan 40 mg once daily. - Monitor blood pressure at home. - FU in 2 weeks

## 2024-03-16 ENCOUNTER — Other Ambulatory Visit: Payer: Self-pay

## 2024-03-18 ENCOUNTER — Ambulatory Visit (INDEPENDENT_AMBULATORY_CARE_PROVIDER_SITE_OTHER): Payer: Self-pay | Admitting: "Endocrinology

## 2024-03-18 ENCOUNTER — Encounter: Payer: Self-pay | Admitting: "Endocrinology

## 2024-03-18 VITALS — BP 122/80 | HR 102 | Ht 66.0 in | Wt 276.0 lb

## 2024-03-18 DIAGNOSIS — R7989 Other specified abnormal findings of blood chemistry: Secondary | ICD-10-CM | POA: Diagnosis not present

## 2024-03-18 NOTE — Patient Instructions (Signed)
Maintain healthy lifestyle including 1200 Cal/day, 30 min of activity/day, avoiding refined/processed/outside food 20 minutes physical activity per day, in continuum or interruptedly through the day  Goals: less than 60 grams of carbohydrate/meal, 1200-1500 Cal/day, 10,0000 steps a day and weight loss of 0.5-1 lb/ wk  Sleep 7-9 hours/day, adapt good sleep hygiene Adapt de-stressing and relaxation techniques to prevent stress induced weight gain Avoid/switch medications that lead to weight gain by discussing with the prescribing physician

## 2024-03-18 NOTE — Progress Notes (Signed)
 Outpatient Endocrinology Note [place], MD    Lashonna Rieke 11-24-1981 [id number]  Referring Provider: Langley Gauss, PA Primary Care Provider: Langley Gauss, PA Reason for consultation: Subjective   Assessment & Plan  Diagnoses and all orders for this visit:  Low serum adrenocorticotrophic hormone (ACTH)  Morbid obesity (HCC) -     Cortisol -     Cancel: ACTH; Future -     T4, free -     ACTH   Patient referred for abnormal dexamethasone suppression test,  10/2023 could not find dexamethasone dose in the med history, dose no dexamethasone labs reported, however patient does report taking a pill at 11 AM, with follow-up next morning cortisol normal at less than 1 which would be considered suppressed/normal, same time patient had an ACTH done which was low at 5.2 with normal DHEA-sulfate Ordered 8 AM baseline cortisol  C/o thin hair, PCOS, difficulty losing weight Pre-diabetes, HTN, and hyperlipidemia 10/2023 TSH WNL, ordered FT4 to confirm no thyroid involvement  Patient reports trying Ozempic in the past but could not tolerate it Reports gradual weight gain, excess weight on maternal side Maintain healthy lifestyle including 1200 Cal/day, 30 min of activity/day, avoiding refined/processed/outside food 20 minutes physical activity per day, in continuum or interruptedly through the day  Goals: less than 60 grams of carbohydrate/meal, 1200-1500 Cal/day, 10,0000 steps a day and weight loss of 0.5-1 lb/ wk  Sleep 7-9 hours/day, adapt good sleep hygiene Adapt de-stressing and relaxation techniques to prevent stress induced weight gain Avoid/switch medications that lead to weight gain by discussing with the prescribing physician    Return in about 3 weeks (around 04/08/2024) for visit, labs today.   I have reviewed current medications, nurse's notes, allergies, vital signs, past medical and surgical history, family medical history, and social history for this  encounter. Counseled patient on symptoms, examination findings, lab findings, imaging results, treatment decisions and monitoring and prognosis. The patient understood the recommendations and agrees with the treatment plan. All questions regarding treatment plan were fully answered.  [place]ia [place], MD  03/18/24   History of Present Illness HPI  Virginia Brock is a 43 y.o. year old female who presents for evaluation of "cortisol".   C/o thin hair, PCOS, difficulty losing weight Pre-diabetes, HTN, and hyperlipidemia  Has taken prednisone/steroid shot off and on intermittently, last 9 day dose finished around around 02/24/24, had another such regimen few weeks prior to that, due to bronchitis  Got steroid shots in past  Weight has been in 200s lbs since she was 22 yr ago Excess weight runs in the family on mother's side  Physical Exam  BP 122/80   Pulse (!) 102   Ht 5\' 6"  (1.676 m)   Wt 276 lb (125.2 kg)   SpO2 98%   BMI 44.55 kg/m    Constitutional: well developed, well nourished Head: normocephalic, atraumatic Eyes: sclera anicteric, no redness Neck: supple Lungs: normal respiratory effort Neurology: alert and oriented Skin: dry, no appreciable rashes Musculoskeletal: no appreciable defects Psychiatric: normal mood and affect   Current Medications Patient's Medications  New Prescriptions   No medications on file  Previous Medications   ACETAMINOPHEN (TYLENOL) 500 MG TABLET    Take 1,000 mg by mouth every 6 (six) hours as needed (for pain.).   ALBUTEROL (VENTOLIN HFA) 108 (90 BASE) MCG/ACT INHALER    Inhale 2 puffs into the lungs every 4 (four) hours as needed for wheezing or shortness of breath (as needed).  SERTRALINE (ZOLOFT) 100 MG TABLET    TAKE 1 TABLET BY MOUTH EVERY DAY   TRIAMCINOLONE OINTMENT (KENALOG) 0.5 %    Apply 1 Application topically 2 (two) times daily.   VALSARTAN (DIOVAN) 40 MG TABLET    Take 1 tablet (40 mg total) by mouth daily.   VITAMIN D,  ERGOCALCIFEROL, (DRISDOL) 1.25 MG (50000 UNIT) CAPS CAPSULE    Take 1 capsule (50,000 Units total) by mouth every 7 (seven) days.  Modified Medications   No medications on file  Discontinued Medications   No medications on file    Allergies Allergies  Allergen Reactions   Buprenorphine Hcl Hives, Other (See Comments) and Rash    Chest pain  Other Reaction(s): Other (See Comments), Other (See Comments)    Chest pain    Chest pain Chest pain   Morphine Hives and Other (See Comments)    Chest Pain  Other Reaction(s): Chest Pain, Other (See Comments), Other (See Comments)    Chest Pain    Other Reaction(s): Other (See Comments)    Chest pain   Dermatitis Antigen Rash    Blisters   Latex Other (See Comments) and Rash    Blisters when wearing latex gloves or condoms but can tolerate if touched briefly by someone with latex glove on  Other Reaction(s): Other (See Comments), Other (See Comments)    Blisters when wearing latex gloves or condoms but can tolerate if touched briefly by someone with latex glove on    Blisters when wearing latex gloves or condoms but can tolerate if touched briefly by someone with latex glove on Blisters when wearing latex gloves or condoms but can tolerate if touched briefly by someone with latex glove on Blisters when wearing latex gloves or condoms but can tolerate if touched briefly by someone with latex glove on   Morphine And Codeine Hives and Other (See Comments)    Chest pain    Adhesive [Tape] Other (See Comments)    Blisters     Past Medical History Past Medical History:  Diagnosis Date   Depression    Fibroid tumor    Right Breast Lumpectomy   Hypertension    Migraine    PCOS (polycystic ovarian syndrome)     Past Surgical History Past Surgical History:  Procedure Laterality Date   BREAST LUMPECTOMY Right    Fibroid Tumor   CESAREAN SECTION      Family History family history includes Atrial fibrillation in her mother;  Autism in her child; Cancer in her father and another family member; Diabetes in an other family member; Parkinson's disease in her mother; Stroke in her mother.  Social History Social History   Socioeconomic History   Marital status: Married    Spouse name: Sybrina Laning   Number of children: 2   Years of education: Not on file   Highest education level: Not on file  Occupational History   Not on file  Tobacco Use   Smoking status: Former    Current packs/day: 0.00    Average packs/day: 0.5 packs/day for 4.0 years (2.0 ttl pk-yrs)    Types: Cigarettes    Start date: 08/15/2009    Quit date: 08/15/2013    Years since quitting: 10.5   Smokeless tobacco: Never  Vaping Use   Vaping status: Never Used  Substance and Sexual Activity   Alcohol use: Yes    Alcohol/week: 1.0 standard drink of alcohol    Types: 1 Standard drinks or equivalent per week  Comment: rarely   Drug use: Not Currently    Types: Marijuana    Comment: Used as a teenager   Sexual activity: Yes    Partners: Male    Birth control/protection: I.U.D.  Other Topics Concern   Not on file  Social History Narrative   ** Merged History Encounter **       Social Drivers of Health   Financial Resource Strain: Low Risk  (05/21/2022)   Overall Financial Resource Strain (CARDIA)    Difficulty of Paying Living Expenses: Not hard at all  Food Insecurity: No Food Insecurity (05/21/2022)   Hunger Vital Sign    Worried About Running Out of Food in the Last Year: Never true    Ran Out of Food in the Last Year: Never true  Transportation Needs: No Transportation Needs (05/21/2022)   PRAPARE - Administrator, Civil Service (Medical): No    Lack of Transportation (Non-Medical): No  Physical Activity: Inactive (05/21/2022)   Exercise Vital Sign    Days of Exercise per Week: 0 days    Minutes of Exercise per Session: 0 min  Stress: No Stress Concern Present (05/21/2022)   [name]  - Occupational Stress Questionnaire    Feeling of Stress : Not at all  Social Connections: Moderately Isolated (05/21/2022)   Social Connection and Isolation Panel [NHANES]    Frequency of Communication with Friends and Family: More than three times a week    Frequency of Social Gatherings with Friends and Family: More than three times a week    Attends Religious Services: Never    Database administrator or Organizations: No    Attends Banker Meetings: Never    Marital Status: Married  Catering manager Violence: Not At Risk (05/21/2022)   Humiliation, Afraid, Rape, and Kick questionnaire    Fear of Current or Ex-Partner: No    Emotionally Abused: No    Physically Abused: No    Sexually Abused: No    Lab Results  Component Value Date   CHOL 204 (H) 10/18/2023   Lab Results  Component Value Date   HDL 44 10/18/2023   Lab Results  Component Value Date   LDLCALC 142 (H) 10/18/2023   Lab Results  Component Value Date   TRIG 97 10/18/2023   Lab Results  Component Value Date   CHOLHDL 4.6 (H) 10/18/2023   Lab Results  Component Value Date   CREATININE 0.75 10/18/2023   No results found for: "GFR"    Component Value Date/Time   NA 141 10/18/2023 0741   K 4.8 10/18/2023 0741   CL 104 10/18/2023 0741   CO2 22 10/18/2023 0741   GLUCOSE 124 (H) 10/18/2023 0741   BUN 14 10/18/2023 0741   CREATININE 0.75 10/18/2023 0741   CALCIUM 9.6 10/18/2023 0741   PROT 7.3 10/18/2023 0741   ALBUMIN 4.4 10/18/2023 0741   AST 22 10/18/2023 0741   ALT 25 10/18/2023 0741   ALKPHOS 87 10/18/2023 0741   BILITOT 0.4 10/18/2023 0741      Latest Ref Rng & Units 10/18/2023    7:41 AM 10/09/2022    9:01 AM 05/21/2022   11:39 AM  BMP  Glucose 70 - 99 mg/dL 161  98  096   BUN 6 - 24 mg/dL 14  19  17    Creatinine 0.57 - 1.00 mg/dL 0.45  4.09  8.11   BUN/Creat Ratio 9 - 23 19  29   24  Sodium 134 - 144 mmol/L 141  139  142   Potassium 3.5 - 5.2 mmol/L 4.8  4.4  4.1   Chloride  96 - 106 mmol/L 104  105  108   CO2 20 - 29 mmol/L 22  19  20    Calcium 8.7 - 10.2 mg/dL 9.6  9.8  9.2        Component Value Date/Time   WBC 5.3 10/18/2023 0741   RBC 5.37 (H) 10/18/2023 0741   HGB 15.7 10/18/2023 0741   HCT 48.3 (H) 10/18/2023 0741   PLT 271 10/18/2023 0741   MCV 90 10/18/2023 0741   MCH 29.2 10/18/2023 0741   MCHC 32.5 10/18/2023 0741   RDW 12.1 10/18/2023 0741   LYMPHSABS 1.1 10/18/2023 0741   EOSABS 0.1 10/18/2023 0741   BASOSABS 0.0 10/18/2023 0741   Lab Results  Component Value Date   TSH 0.825 10/18/2023   TSH 1.320 10/09/2022   TSH 1.380 01/10/2022         Parts of this note may have been dictated using voice recognition software. There may be variances in spelling and vocabulary which are unintentional. Not all errors are proofread. Please notify the [facility] if any discrepancies are noted or if the meaning of any statement is not clear.

## 2024-03-19 ENCOUNTER — Ambulatory Visit: Admitting: Family Medicine

## 2024-03-22 LAB — CORTISOL: Cortisol, Plasma: 8.2 ug/dL

## 2024-03-22 LAB — ACTH: C206 ACTH: 18 pg/mL (ref 6–50)

## 2024-03-22 LAB — T4, FREE: Free T4: 1.1 ng/dL (ref 0.8–1.8)

## 2024-04-13 ENCOUNTER — Ambulatory Visit: Admitting: "Endocrinology

## 2024-05-07 ENCOUNTER — Other Ambulatory Visit: Payer: Self-pay | Admitting: Physician Assistant

## 2024-05-07 DIAGNOSIS — E559 Vitamin D deficiency, unspecified: Secondary | ICD-10-CM

## 2024-08-16 ENCOUNTER — Other Ambulatory Visit: Payer: Self-pay | Admitting: Physician Assistant

## 2024-08-16 DIAGNOSIS — F331 Major depressive disorder, recurrent, moderate: Secondary | ICD-10-CM
